# Patient Record
Sex: Female | Born: 1965 | Race: White | Hispanic: No | Marital: Married | Smoking: Never smoker
Health system: Southern US, Community
[De-identification: ages and names within clinical notes are randomized; demographics above are authoritative.]

## PROBLEM LIST (undated history)

## (undated) DIAGNOSIS — E785 Hyperlipidemia, unspecified: Secondary | ICD-10-CM

## (undated) HISTORY — PX: SHOULDER SURGERY: SHX246

## (undated) HISTORY — PX: LASIK: SHX215

---

## 2004-04-03 ENCOUNTER — Inpatient Hospital Stay (HOSPITAL_COMMUNITY): Admission: AD | Admit: 2004-04-03 | Discharge: 2004-04-04 | Payer: Self-pay | Admitting: Obstetrics & Gynecology

## 2005-01-08 ENCOUNTER — Inpatient Hospital Stay (HOSPITAL_COMMUNITY): Admission: AD | Admit: 2005-01-08 | Discharge: 2005-01-08 | Payer: Self-pay | Admitting: Family Medicine

## 2006-04-11 ENCOUNTER — Other Ambulatory Visit: Admission: RE | Admit: 2006-04-11 | Discharge: 2006-04-11 | Payer: Self-pay | Admitting: Obstetrics and Gynecology

## 2006-04-29 ENCOUNTER — Ambulatory Visit (HOSPITAL_COMMUNITY): Admission: RE | Admit: 2006-04-29 | Discharge: 2006-04-29 | Payer: Self-pay | Admitting: Obstetrics and Gynecology

## 2007-05-22 ENCOUNTER — Ambulatory Visit (HOSPITAL_COMMUNITY): Admission: RE | Admit: 2007-05-22 | Discharge: 2007-05-22 | Payer: Self-pay | Admitting: Obstetrics and Gynecology

## 2007-06-01 ENCOUNTER — Encounter: Admission: RE | Admit: 2007-06-01 | Discharge: 2007-06-01 | Payer: Self-pay | Admitting: Obstetrics and Gynecology

## 2007-10-28 ENCOUNTER — Ambulatory Visit: Payer: Self-pay | Admitting: Vascular Surgery

## 2008-01-27 ENCOUNTER — Ambulatory Visit: Payer: Self-pay | Admitting: Vascular Surgery

## 2008-03-02 ENCOUNTER — Ambulatory Visit: Payer: Self-pay | Admitting: Vascular Surgery

## 2008-03-09 ENCOUNTER — Ambulatory Visit: Payer: Self-pay | Admitting: Vascular Surgery

## 2008-04-22 ENCOUNTER — Encounter: Admission: RE | Admit: 2008-04-22 | Discharge: 2008-04-22 | Payer: Self-pay | Admitting: Obstetrics and Gynecology

## 2008-05-13 ENCOUNTER — Ambulatory Visit: Payer: Self-pay | Admitting: Vascular Surgery

## 2009-05-26 ENCOUNTER — Encounter: Payer: Self-pay | Admitting: Endocrinology

## 2009-10-12 ENCOUNTER — Encounter: Admission: RE | Admit: 2009-10-12 | Discharge: 2009-10-12 | Payer: Self-pay | Admitting: Obstetrics and Gynecology

## 2010-10-17 ENCOUNTER — Ambulatory Visit (HOSPITAL_COMMUNITY)
Admission: RE | Admit: 2010-10-17 | Discharge: 2010-10-17 | Payer: Self-pay | Source: Home / Self Care | Admitting: Obstetrics and Gynecology

## 2011-04-23 NOTE — Assessment & Plan Note (Signed)
OFFICE VISIT   [name], [name]  DOB:  1966-03-11                                       01/27/2008  NFAOZ#:[id number]   The patient presents today for continued followup of her lower extremity  venous pathology.  I had seen her initially in November.  She continues  to have difficulty with left leg pain and prolonged standing.  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  She has  difficulty with housework and chores due to the leg pain, and also walks  for exercise, but has pain associated with this as well.  She has worn  thigh-high graduated compression garments for over 3 months and reports  this is giving no relief of symptoms.  She elevates her legs as much as  possible, and also takes ibuprofen 600 mg t.i.d. with no relief of her  symptoms.  She did undergo formal duplex today in our office, and this  confirmed reflux throughout her left greater saphenous vein from the  saphenofemoral junction distally.  Fortunately, her deep system is  competent with no evidence of reflux.  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  I explained that this  would be expected to give relief of her discomfort due to the severe  venous hypertension that she has.  I explained that the procedure is an  outpatient under local anesthesia in our office.  We will assure  insurance coverage, and then proceed with treatment at her convenience.   Larina Earthly, M.D.  Electronically Signed   TFE/MEDQ  D:  01/27/2008  T:  01/28/2008  Job:  [id number]   cc:   Dalbert Mayotte, M.D.

## 2011-04-23 NOTE — Assessment & Plan Note (Signed)
OFFICE VISIT   [name] [name], [name] [name]  DOB:  Dec 02, 1966                                       05/13/2008  ZOXWR#:[id number]   The patient presents today for final followup following her left leg  laser ablation and stab phlebectomy.  She is quite pleased with her  result.  She has no complications following this and has a resolution of  the bruising and erythema around the time of the procedure.  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  She understands this is of no concern regarding  medical issues and is cosmetic concern only.  I plan to see her again on  a p.r.n. basis.   Larina Earthly, M.D.  Electronically Signed   TFE/MEDQ  D:  05/13/2008  T:  05/16/2008  Job:  [id number]   cc:   Dalbert Mayotte, M.D.

## 2011-04-23 NOTE — Assessment & Plan Note (Signed)
OFFICE VISIT   [name] [name], [name] [name]  DOB:  03-Jun-1966                                       03/09/2008  AOZHY#:[id number]   The patient presents today for 1-week followup of laser ablation for  left greater saphenous vein and stab phlebectomy of multiple tributary  varicosities.  She has done well since her procedure.  She was able to  return to work 2 days following the procedure.  She did report some  soreness that was controlled with ibuprofen.  She has the usual amount  of bruising in the thigh and there is soreness over the ablation site.  She has minimal bruising and no tenderness over the stab phlebectomy  sites.  She underwent limited venous duplex evaluation in our office  today, and it reveals closure of her saphenous vein throughout her thigh  up to the saphenofemoral junction.  Her common femoral vein is widely  patent without injury.  I am quite pleased with the patient's initial  results.  Plan to see her again in 2 months for final followup.   Larina Earthly, M.D.  Electronically Signed   TFE/MEDQ  D:  03/09/2008  T:  03/09/2008  Job:  [id number]   cc:   Dalbert Mayotte, M.D.

## 2011-04-23 NOTE — Consult Note (Signed)
NEW PATIENT CONSULTATION   [name], [name]  DOB:  1966/08/12                                       10/28/2007  ZOXWR#:[id number]   HISTORY OF PRESENT ILLNESS:  [name] [name] presents today for evaluation of  her left leg venous varicosities.  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  Over the past 6 months she has had progressive pain  associated with these.  The pain is more pronounced at the end of long  shifts of standing and does have pain specifically over the varicosities  and also a dull aching sensation in her distal calf at the end of the  day.  She does not have any history of superficial thrombophlebitis,  deep venous thrombosis or hemorrhage.   PAST MEDICAL HISTORY:  Negative for any major medical difficulties.  She  does have a history of a tonsillectomy as a child, ovarian cyst removal  in 1990, tubal ligation 1999.   SOCIAL HISTORY:  She is married with 2 children and does not smoke or  drink alcohol.   Her weight is 127 pounds, height 5 feet 1 inches.   REVIEW OF SYSTEMS:  Her review of systems is totally negative aside from  her leg discomfort related to the varicosities.   ALLERGIES:  No known drug allergies.   MEDICATIONS:  Loestrin and ibuprofen p.r.n. leg discomfort.   PHYSICAL EXAMINATION:  Well-developed, well-nourished white female  appearing her stated age of 2.  Blood pressure is 102/60, pulse 64,  respirations 16.  Her dorsalis pedis pulses are 2+ bilaterally.  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  On hand held duplex, she does  appear to be reflex in the saphenous vein above this the saphenous vein  is feeding into this particular varicosity  at the level of the knee.  I  discussed options with [name].  We have fitted her with thigh-high  graduated compression garments 20 to 30 millimeters of mercury and I  instructed her on the use of these.  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 Also she is using ibuprofen 600 mg p.r.n. pain.  She will being the graduated compression garment usage and we will see  her again in 3 months for a continued discussion.  She will also undergo  formal duplex evaluation at that time.  I did discuss the option of  laser ablation of her saphenous vein with tributary varicosity  phlebectomy as an outpatient should she fail conservative therapy.   [name] [name], M.D.  Electronically Signed   TFE/MEDQ  D:  10/28/2007  T:  10/29/2007  Job:  [id number]   cc:   [name] [name], M.D.

## 2011-04-23 NOTE — Procedures (Signed)
LOWER EXTREMITY VENOUS REFLUX EXAM   INDICATION:  Left lower extremity varicose vein with pain.   EXAM:  Using color-flow imaging and pulse Doppler spectral analysis, the  left common femoral, superficial femoral, popliteal, posterior tibial,  greater and lesser saphenous veins are evaluated.  There is no evidence  suggesting deep venous insufficiency in the left lower extremity.   The left saphenofemoral junction is not competent.  The left GSV is not  competent with the caliber as described below.   The left proximal short saphenous vein demonstrates competency.   GSV Diameter (used if found to be incompetent only)                                            Right    Left  Proximal Greater Saphenous Vein           cm       0.99 cm  Proximal-to-mid-thigh                     cm       0.49 cm  Mid thigh                                 cm       0.48 cm  Mid-distal thigh                          cm       0.53 cm  Distal thigh                              cm       0.45 cm  Knee                                      cm       0.44 cm   IMPRESSION:  1. Left greater saphenous vein reflux is identified with the caliber      ranging from 0.44 cm to 0.99 cm knee to groin.  2. The left greater saphenous vein is not aneurysmal.  3. The left greater saphenous vein is not tortuous.  4. The deep venous system is competent.  5. The left lesser saphenous vein is competent.  6. No evidence of deep venous thrombosis or superficial thrombosis in      the left lower extremity.  7. Large varicose vein branches off of left greater saphenous vein      just proximal to the knee.   ___________________________________________  Larina Earthly, M.D.   AS/MEDQ  D:  01/27/2008  T:  01/28/2008  Job:  [id number]

## 2011-09-09 ENCOUNTER — Other Ambulatory Visit (HOSPITAL_COMMUNITY): Payer: Self-pay | Admitting: Obstetrics and Gynecology

## 2011-09-09 DIAGNOSIS — Z1231 Encounter for screening mammogram for malignant neoplasm of breast: Secondary | ICD-10-CM

## 2011-10-18 ENCOUNTER — Ambulatory Visit (HOSPITAL_COMMUNITY): Payer: BC Managed Care – PPO | Attending: Obstetrics and Gynecology

## 2011-11-20 ENCOUNTER — Ambulatory Visit (HOSPITAL_COMMUNITY)
Admission: RE | Admit: 2011-11-20 | Discharge: 2011-11-20 | Disposition: A | Discharge status: Home / Self Care | Payer: BC Managed Care – PPO | Source: Ambulatory Visit | Attending: Obstetrics and Gynecology | Admitting: Obstetrics and Gynecology

## 2011-11-20 DIAGNOSIS — Z1231 Encounter for screening mammogram for malignant neoplasm of breast: Secondary | ICD-10-CM | POA: Insufficient documentation

## 2012-10-27 ENCOUNTER — Other Ambulatory Visit (HOSPITAL_COMMUNITY): Payer: Self-pay | Admitting: Obstetrics and Gynecology

## 2012-10-27 DIAGNOSIS — Z1231 Encounter for screening mammogram for malignant neoplasm of breast: Secondary | ICD-10-CM

## 2012-11-26 ENCOUNTER — Ambulatory Visit (HOSPITAL_COMMUNITY)
Admission: RE | Admit: 2012-11-26 | Discharge: 2012-11-26 | Disposition: A | Discharge status: Home / Self Care | Payer: BC Managed Care – PPO | Source: Ambulatory Visit | Attending: Obstetrics and Gynecology | Admitting: Obstetrics and Gynecology

## 2012-11-26 DIAGNOSIS — Z1231 Encounter for screening mammogram for malignant neoplasm of breast: Secondary | ICD-10-CM | POA: Insufficient documentation

## 2012-12-08 ENCOUNTER — Other Ambulatory Visit: Payer: Self-pay | Admitting: Obstetrics and Gynecology

## 2012-12-08 DIAGNOSIS — R928 Other abnormal and inconclusive findings on diagnostic imaging of breast: Secondary | ICD-10-CM

## 2012-12-18 ENCOUNTER — Ambulatory Visit
Admission: RE | Admit: 2012-12-18 | Discharge: 2012-12-18 | Disposition: A | Discharge status: Home / Self Care | Payer: BC Managed Care – PPO | Source: Ambulatory Visit | Attending: Obstetrics and Gynecology | Admitting: Obstetrics and Gynecology

## 2012-12-18 DIAGNOSIS — R928 Other abnormal and inconclusive findings on diagnostic imaging of breast: Secondary | ICD-10-CM

## 2013-09-29 DIAGNOSIS — F32A Depression, unspecified: Secondary | ICD-10-CM | POA: Insufficient documentation

## 2013-09-29 DIAGNOSIS — F329 Major depressive disorder, single episode, unspecified: Secondary | ICD-10-CM | POA: Insufficient documentation

## 2013-12-22 ENCOUNTER — Other Ambulatory Visit: Payer: Self-pay | Admitting: Obstetrics and Gynecology

## 2013-12-22 DIAGNOSIS — Z1231 Encounter for screening mammogram for malignant neoplasm of breast: Secondary | ICD-10-CM

## 2013-12-28 ENCOUNTER — Ambulatory Visit (INDEPENDENT_AMBULATORY_CARE_PROVIDER_SITE_OTHER): Payer: BC Managed Care – PPO

## 2013-12-28 DIAGNOSIS — Z1231 Encounter for screening mammogram for malignant neoplasm of breast: Secondary | ICD-10-CM

## 2014-07-06 DIAGNOSIS — M419 Scoliosis, unspecified: Secondary | ICD-10-CM | POA: Insufficient documentation

## 2015-04-23 ENCOUNTER — Emergency Department
Admission: EM | Admit: 2015-04-23 | Discharge: 2015-04-23 | Disposition: A | Discharge status: Home / Self Care | Payer: BLUE CROSS/BLUE SHIELD | Source: Home / Self Care | Attending: Emergency Medicine | Admitting: Emergency Medicine

## 2015-04-23 ENCOUNTER — Encounter: Payer: Self-pay | Admitting: *Deleted

## 2015-04-23 DIAGNOSIS — S61219A Laceration without foreign body of unspecified finger without damage to nail, initial encounter: Secondary | ICD-10-CM

## 2015-04-23 HISTORY — DX: Hyperlipidemia, unspecified: E78.5

## 2015-04-23 NOTE — Discharge Instructions (Signed)

## 2015-04-23 NOTE — ED Notes (Signed)
Pt was cutting flowers out of her garden at approx 330 today and cut her L ring finger.  Pain 6/10,  She says she is up to date with her tetanus shot.

## 2015-04-24 NOTE — ED Provider Notes (Signed)
CSN: [id number]     Arrival date & time 04/23/15  1551 History   First MD Initiated Contact with Patient 04/23/15 1613     Chief Complaint  Patient presents with  . Extremity Laceration    L ring finger     (Consider location/radiation/quality/duration/timing/severity/associated sxs/prior Treatment) Patient is a 49 y.o. female presenting with hand pain. The history is provided by the patient. No language interpreter was used.  Hand Pain This is a new problem. The current episode started today. The problem occurs constantly. The problem has been unchanged. Nothing aggravates the symptoms. She has tried nothing for the symptoms. The treatment provided no relief.  Pt cut left ring finger while cutting flowers in her garden.  Pt complains of a cut to her finger.    Past Medical History  Diagnosis Date  . Hyperlipemia    Past Surgical History  Procedure Laterality Date  . Lasik     Family History  Problem Relation Age of Onset  . Renal Disease Father   . Heart disease Father    History  Substance Use Topics  . Smoking status: Never Smoker   . Smokeless tobacco: Never Used  . Alcohol Use: No   OB History    No data available     Review of Systems  All other systems reviewed and are negative.     Allergies  Review of patient's allergies indicates no known allergies.  Home Medications   Prior to Admission medications   Medication Sig Start Date End Date Taking? Authorizing Provider  escitalopram (LEXAPRO) 20 MG tablet Take 20 mg by mouth daily.   Yes Historical Provider, MD  norethindrone-ethinyl estradiol (JUNEL FE,GILDESS FE,LOESTRIN FE) 1-20 MG-MCG tablet Take 1 tablet by mouth daily.   Yes Historical Provider, MD   BP 112/71 mmHg  Pulse 74  Temp(Src) 98.1 F (36.7 C) (Oral)  Ht 5\' 1"  (1.549 m)  Wt 138 lb (62.596 kg)  BMI 26.09 kg/m2  SpO2 96% Physical Exam  Constitutional: She is oriented to person, place, and time. She appears well-developed and  well-nourished.  HENT:  Head: Normocephalic.  Eyes: EOM are normal.  Musculoskeletal: Normal range of motion.  5mm lacertion tip of left index finger,  Slightly larger than a paper cut, minimal gapping with pulling apart  Neurological: She is alert and oriented to person, place, and time.  Skin: Skin is warm.  Psychiatric: She has a normal mood and affect.  Nursing note and vitals reviewed.   ED Course  LACERATION REPAIR Date/Time: 04/24/2015 8:13 AM Performed by: Elson AreasSOFIA, Roseana Rhine K Authorized by: Elson AreasSOFIA, Marc Leichter K Consent: Verbal consent obtained. Risks and benefits: risks, benefits and alternatives were discussed Patient understanding: patient states understanding of the procedure being performed Time out: Immediately prior to procedure a "time out" was called to verify the correct patient, procedure, equipment, support staff and site/side marked as required. Laceration length: 0.5 cm Tendon involvement: none Nerve involvement: none Preparation: Patient was prepped and draped in the usual sterile fashion. Amount of cleaning: standard Debridement: none Skin closure: glue Approximation difficulty: simple Patient tolerance: Patient tolerated the procedure well with no immediate complications   (including critical care time) Labs Review Labs Reviewed - No data to display  Imaging Review No results found.   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

## 2016-01-28 ENCOUNTER — Emergency Department
Admission: EM | Admit: 2016-01-28 | Discharge: 2016-01-28 | Disposition: A | Discharge status: Home / Self Care | Payer: BLUE CROSS/BLUE SHIELD | Source: Home / Self Care | Attending: Family Medicine | Admitting: Family Medicine

## 2016-01-28 ENCOUNTER — Encounter: Payer: Self-pay | Admitting: Emergency Medicine

## 2016-01-28 DIAGNOSIS — N3090 Cystitis, unspecified without hematuria: Secondary | ICD-10-CM | POA: Diagnosis not present

## 2016-01-28 LAB — POCT URINALYSIS DIP (MANUAL ENTRY)
BILIRUBIN UA: NEGATIVE
Bilirubin, UA: NEGATIVE
GLUCOSE UA: NEGATIVE
Nitrite, UA: NEGATIVE
SPEC GRAV UA: 1.025
Urobilinogen, UA: 0.2
pH, UA: 5.5

## 2016-01-28 MED ORDER — PHENAZOPYRIDINE HCL 200 MG PO TABS: 200.0000 mg | ORAL_TABLET | Freq: Three times a day (TID) | ORAL | Status: DC

## 2016-01-28 MED ORDER — NITROFURANTOIN MONOHYD MACRO 100 MG PO CAPS: 100.0000 mg | ORAL_CAPSULE | Freq: Two times a day (BID) | ORAL | Status: DC

## 2016-01-28 NOTE — Discharge Instructions (Signed)
Increase fluid intake. °If symptoms become significantly worse during the night or over the weekend, proceed to the local emergency room.  ° ° °Urinary Tract Infection °Urinary tract infections (UTIs) can develop anywhere along your urinary tract. Your urinary tract is your body's drainage system for removing wastes and extra water. Your urinary tract includes two kidneys, two ureters, a bladder, and a urethra. Your kidneys are a pair of bean-shaped organs. Each kidney is about the size of your fist. They are located below your ribs, one on each side of your spine. °CAUSES °Infections are caused by microbes, which are microscopic organisms, including fungi, viruses, and bacteria. These organisms are so small that they can only be seen through a microscope. Bacteria are the microbes that most commonly cause UTIs. °SYMPTOMS  °Symptoms of UTIs may vary by age and gender of the patient and by the location of the infection. Symptoms in young women typically include a frequent and intense urge to urinate and a painful, burning feeling in the bladder or urethra during urination. Older women and men are more likely to be tired, shaky, and weak and have muscle aches and abdominal pain. A fever may mean the infection is in your kidneys. Other symptoms of a kidney infection include pain in your back or sides below the ribs, nausea, and vomiting. °DIAGNOSIS °To diagnose a UTI, your caregiver will ask you about your symptoms. Your caregiver will also ask you to provide a urine sample. The urine sample will be tested for bacteria and white blood cells. White blood cells are made by your body to help fight infection. °TREATMENT  °Typically, UTIs can be treated with medication. Because most UTIs are caused by a bacterial infection, they usually can be treated with the use of antibiotics. The choice of antibiotic and length of treatment depend on your symptoms and the type of bacteria causing your infection. °HOME CARE  INSTRUCTIONS °· If you were prescribed antibiotics, take them exactly as your caregiver instructs you. Finish the medication even if you feel better after you have only taken some of the medication. °· Drink enough water and fluids to keep your urine clear or pale yellow. °· Avoid caffeine, tea, and carbonated beverages. They tend to irritate your bladder. °· Empty your bladder often. Avoid holding urine for long periods of time. °· Empty your bladder before and after sexual intercourse. °· After a bowel movement, women should cleanse from front to back. Use each tissue only once. °SEEK MEDICAL CARE IF:  °· You have back pain. °· You develop a fever. °· Your symptoms do not begin to resolve within 3 days. °SEEK IMMEDIATE MEDICAL CARE IF:  °· You have severe back pain or lower abdominal pain. °· You develop chills. °· You have nausea or vomiting. °· You have continued burning or discomfort with urination. °MAKE SURE YOU:  °· Understand these instructions. °· Will watch your condition. °· Will get help right away if you are not doing well or get worse. °  °This information is not intended to replace advice given to you by your health care provider. Make sure you discuss any questions you have with your health care provider. °  °Document Released: 09/04/2005 Document Revised: 08/16/2015 Document Reviewed: 01/03/2012 °Elsevier Interactive Patient Education ©2016 [employer] ° °

## 2016-01-28 NOTE — ED Notes (Signed)
Pt c/o possible UTI, right flank pain radiating around to side.  Having pressure, frequency, urgency and dysuria.  Pt does have a history of kidney stones.

## 2016-01-28 NOTE — ED Provider Notes (Signed)
CSN: [id number]     Arrival date & time 01/28/16  1218 History   First MD Initiated Contact with Patient 01/28/16 1300     Chief Complaint  Patient presents with  . Urinary Tract Infection      HPI Comments: Patient developed right flank pain radiating to her right abdomen yesterday with urinary frequency, urgency, and dysuria.  She had nausea without vomiting.  She has a past history of kidney stones.  She has noted no hematuria.  No fevers, chills, and sweats.  Patient is a 50 y.o. female presenting with dysuria. The history is provided by the patient.  Dysuria Pain quality:  Burning Pain severity:  Mild Onset quality:  Sudden Duration:  1 day Timing:  Constant Progression:  Unchanged Chronicity:  New Recent urinary tract infections: no   Relieved by:  None tried Worsened by:  Nothing tried Ineffective treatments:  None tried Urinary symptoms: frequent urination and hesitancy   Urinary symptoms: no discolored urine, no foul-smelling urine, no hematuria and no bladder incontinence   Associated symptoms: flank pain   Associated symptoms: no abdominal pain, no fever, no genital lesions, no nausea, no vaginal discharge and no vomiting   Risk factors: hx of urolithiasis     Past Medical History  Diagnosis Date  . Hyperlipemia    Past Surgical History  Procedure Laterality Date  . Lasik     Family History  Problem Relation Age of Onset  . Renal Disease Father   . Heart disease Father    Social History  Substance Use Topics  . Smoking status: Never Smoker   . Smokeless tobacco: Never Used  . Alcohol Use: No   OB History    No data available     Review of Systems  Constitutional: Negative for fever.  Gastrointestinal: Negative for nausea, vomiting and abdominal pain.  Genitourinary: Positive for dysuria and flank pain. Negative for vaginal discharge.  All other systems reviewed and are negative.   Allergies  Review of patient's allergies indicates no known  allergies.  Home Medications   Prior to Admission medications   Medication Sig Start Date End Date Taking? Authorizing Provider  Cholecalciferol (VITAMIN D-3 PO) Take by mouth.   Yes Historical Provider, MD  Cyanocobalamin (VITAMIN B12 PO) Take by mouth.   Yes Historical Provider, MD  norethindrone-ethinyl estradiol (MICROGESTIN,JUNEL,LOESTRIN) 1-20 MG-MCG tablet Take 1 tablet by mouth daily.   Yes Historical Provider, MD  escitalopram (LEXAPRO) 20 MG tablet Take 20 mg by mouth daily.    Historical Provider, MD  nitrofurantoin, macrocrystal-monohydrate, (MACROBID) 100 MG capsule Take 1 capsule (100 mg total) by mouth 2 (two) times daily. Take with food. 01/28/16   Lattie Haw, MD  phenazopyridine (PYRIDIUM) 200 MG tablet Take 1 tablet (200 mg total) by mouth 3 (three) times daily. Take after meals. 01/28/16   Lattie Haw, MD   Meds Ordered and Administered this Visit  Medications - No data to display  BP 112/72 mmHg  Pulse 71  Temp(Src) 98.4 F (36.9 C) (Oral)  Ht  (1.549 m)  Wt 141 lb 8 oz (64.184 kg)  BMI 26.75 kg/m2  SpO2 95%  LMP 01/16/2016 No data found.   Physical Exam Nursing notes and Vital Signs reviewed. Appearance:  Patient appears stated age, and in no acute distress.    Eyes:  Pupils are equal, round, and reactive to light and accomodation.  Extraocular movement is intact.  Conjunctivae are not inflamed   Pharynx:  Normal;  moist mucous membranes  Neck:  Supple.  No adenopathy Lungs:  Clear to auscultation.  Breath sounds are equal.  Moving air well. Heart:  Regular rate and rhythm without murmurs, rubs, or gallops.  Abdomen:  Nontender without masses or hepatosplenomegaly.  Bowel sounds are present.  No CVA or flank tenderness.  Extremities:  No edema.  Skin:  No rash present.    ED Course  Procedures none    Labs Reviewed  POCT URINALYSIS DIP (MANUAL ENTRY) - Abnormal; Notable for the following:    Blood, UA moderate (*)    Protein Ur, POC trace  (*)    Leukocytes, UA moderate (2+) (*)    All other components within normal limits  URINE CULTURE      MDM   1. Cystitis; ?nephrolithiasis    Urine culture pending. Begin Macrobid  BID for one week. Rx for Pyridium Increase fluid intake. If symptoms become significantly worse during the night or over the weekend, proceed to the local emergency room.  Followup with Family Doctor if not improved in one week.     Lattie Haw, MD 01/30/16 2206

## 2016-01-29 ENCOUNTER — Other Ambulatory Visit: Payer: Self-pay | Admitting: Obstetrics and Gynecology

## 2016-01-29 DIAGNOSIS — R928 Other abnormal and inconclusive findings on diagnostic imaging of breast: Secondary | ICD-10-CM

## 2016-01-31 LAB — URINE CULTURE

## 2016-02-01 ENCOUNTER — Telehealth: Payer: Self-pay | Admitting: *Deleted

## 2016-02-14 ENCOUNTER — Ambulatory Visit
Admission: RE | Admit: 2016-02-14 | Discharge: 2016-02-14 | Disposition: A | Discharge status: Home / Self Care | Payer: BLUE CROSS/BLUE SHIELD | Source: Ambulatory Visit | Attending: Obstetrics and Gynecology | Admitting: Obstetrics and Gynecology

## 2016-02-14 DIAGNOSIS — R928 Other abnormal and inconclusive findings on diagnostic imaging of breast: Secondary | ICD-10-CM

## 2018-02-05 ENCOUNTER — Other Ambulatory Visit: Payer: Self-pay | Admitting: Obstetrics and Gynecology

## 2018-02-05 DIAGNOSIS — N6320 Unspecified lump in the left breast, unspecified quadrant: Secondary | ICD-10-CM

## 2018-02-11 ENCOUNTER — Ambulatory Visit
Admission: RE | Admit: 2018-02-11 | Discharge: 2018-02-11 | Disposition: A | Discharge status: Home / Self Care | Payer: BLUE CROSS/BLUE SHIELD | Source: Ambulatory Visit | Attending: Obstetrics and Gynecology | Admitting: Obstetrics and Gynecology

## 2018-02-11 DIAGNOSIS — N6320 Unspecified lump in the left breast, unspecified quadrant: Secondary | ICD-10-CM

## 2018-05-06 DIAGNOSIS — M79642 Pain in left hand: Secondary | ICD-10-CM | POA: Insufficient documentation

## 2019-03-11 ENCOUNTER — Telehealth (INDEPENDENT_AMBULATORY_CARE_PROVIDER_SITE_OTHER): Payer: Self-pay

## 2019-03-11 NOTE — Telephone Encounter (Signed)
Called patient. No answer LMOM to return our call. If they return call, please ask them screening questions below. Thank you.   Do you have now or have you had in the past 7 days a fever and/or chills?   Do you have now or have you had in the past 7 days a cough?   Do you have now or have you had in the last 7 days nausea, vomiting or abdominal pain?   Have you been exposed to anyone who has tested positive for COVID-19?   Have you or anyone who lives with you traveled within the last month? 

## 2019-03-15 ENCOUNTER — Other Ambulatory Visit: Payer: Self-pay

## 2019-03-15 ENCOUNTER — Ambulatory Visit (INDEPENDENT_AMBULATORY_CARE_PROVIDER_SITE_OTHER): Payer: BLUE CROSS/BLUE SHIELD | Admitting: Family Medicine

## 2019-03-15 ENCOUNTER — Ambulatory Visit (INDEPENDENT_AMBULATORY_CARE_PROVIDER_SITE_OTHER): Payer: BLUE CROSS/BLUE SHIELD

## 2019-03-15 ENCOUNTER — Encounter (INDEPENDENT_AMBULATORY_CARE_PROVIDER_SITE_OTHER): Payer: Self-pay | Admitting: Family Medicine

## 2019-03-15 VITALS — Ht 61.0 in | Wt 136.0 lb

## 2019-03-15 DIAGNOSIS — R202 Paresthesia of skin: Secondary | ICD-10-CM

## 2019-03-15 DIAGNOSIS — M25551 Pain in right hip: Secondary | ICD-10-CM

## 2019-03-15 DIAGNOSIS — R200 Anesthesia of skin: Secondary | ICD-10-CM | POA: Diagnosis not present

## 2019-03-15 DIAGNOSIS — M1812 Unilateral primary osteoarthritis of first carpometacarpal joint, left hand: Secondary | ICD-10-CM

## 2019-03-15 DIAGNOSIS — E785 Hyperlipidemia, unspecified: Secondary | ICD-10-CM | POA: Insufficient documentation

## 2019-03-15 MED ORDER — NABUMETONE 750 MG PO TABS: 750.0000 mg | ORAL_TABLET | Freq: Two times a day (BID) | ORAL | 6 refills | Status: DC | PRN

## 2019-03-15 MED ORDER — TIZANIDINE HCL 2 MG PO TABS: 2.0000 mg | ORAL_TABLET | Freq: Every evening | ORAL | 1 refills | Status: DC | PRN

## 2019-03-15 NOTE — Patient Instructions (Signed)
   Zinc 20-30 mg daily (immune function)  Arthritis:  Glucosamine Sulfate 1,000 mg twice daily  Turmeric 500 mg twice daily  Magnesium 400 mg daily

## 2019-03-15 NOTE — Progress Notes (Signed)
Office Visit Note   Patient: [name] [name]           Date of Birth: Sep 25, 1966           MRN: [id number] Visit Date: 03/15/2019 Requested by: No referring provider defined for this encounter. PCP: [place], [name] [name]  Subjective: Chief Complaint  Patient presents with  . Right Hip - Pain    C/O Pain over right SI joint, chiropractor x 5 years with no relief from this pain, onset 4-5 months, NKI,     HPI: She is a 53 year old seen at the request of Dr. Juluis [name] for right posterior hip pain.  She also is having troubles with numbness and tingling in her right hand, as well as left thumb pain.  She has been been diagnosed with scoliosis since childhood.  In the past 5 years she has been going to her chiropractor with good relief of symptoms, but in the past 4 or 5 months the treatments are not helping like they used to.  Pain seems to be near the SI joint with some pain in the buttocks area and occasional radiation into the hamstring.  It is worst when transitioning from sitting to standing, worse when she first gets out of bed and starts walking, and better after she moves around a little while.  She denies any numbness or tingling in her feet, no bowel or bladder dysfunction.  She takes ibuprofen on a regular basis with some improvement.  She has had numbness and tingling in her right hand intermittently for several months.  No weakness, no significant pain.  No neck pain with this.  It bothers her when she is holding onto something such as her mobile phone.  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 Amanda [name].  She was told she might need surgery 1 day.  She was given an over-the-counter brace which did not help much.  She is trying to avoid surgery.                ROS: Denies fevers or chills, no respiratory symptoms.  All other systems were reviewed and are negative.  Objective: Vital Signs: Ht 5\' 1"  (1.549 m)   Wt 136 lb (61.7 kg)   BMI 25.70 kg/m   Physical Exam:   General:  Alert and oriented, in no acute distress. Pulm:  Breathing unlabored. Psy:  Normal mood, congruent affect. Skin: No rash on her skin. Right hand: Full range of motion of her elbow and wrist pain-free.  Slight subluxation of the ulnar nerve at the elbow but negative ulnar compression test.  Positive Tinel's at the right carpal tunnel and positive Phalen's test.  No atrophy of the thenar muscles, intrinsic hand strength is normal. Left hand: Tender at the thumb [facility] joint, negative grind test. Low back: She has significant scoliosis.  She is tender along the right sacroiliac joint and also in the right sciatic notch.  No pain over the greater trochanter, good range of motion of her hips with no pain.  Lower extremity strength and reflexes are normal.  Imaging: X-rays pelvis and right hip: Hip joints are well-preserved with no significant arthritic change.  Right-sided SI joint is slightly sclerotic compared to the left.  She has substantial lumbar scoliosis.  No sign of compression deformity.  Assessment & Plan: 1.  Right posterior hip pain, possibilities include sacroiliac dysfunction, piriformis syndrome, or possibly lumbar foraminal stenosis.  Neurologic exam is nonfocal. -At some point she  might benefit from lumbar MRI scan but due to coronavirus, this cannot be done right now.  We will try a different anti-inflammatory, muscle relaxant as needed, and continued chiropractic.  Start doing piriformis stretches as well.  If pain became severe, could do a sacroiliac injection but would like to avoid that for now.  2.  Right hand numbness, suspect carpal tunnel syndrome - Carpal tunnel night splint for the next 6 weeks.  If symptoms persist then nerve studies followed by injection if indicated, or surgical consult if indicated.  3.  Left thumb CMC arthrosis - Hand therapy referral for custom splint. -Glucosamine sulfate, turmeric, avoid sugar intake.     Procedures: No procedures  performed  No notes on file     PMFS History: Patient Active Problem List   Diagnosis Date Noted  . Hyperlipidemia 03/15/2019  . Scoliosis 07/06/2014  . Depression 09/29/2013   Past Medical History:  Diagnosis Date  . Hyperlipemia     Family History  Problem Relation Age of Onset  . Renal Disease Father   . Heart disease Father     Past Surgical History:  Procedure Laterality Date  . LASIK     Social History   Occupational History  . Not on file  Tobacco Use  . Smoking status: Never Smoker  . Smokeless tobacco: Never Used  Substance and Sexual Activity  . Alcohol use: No  . Drug use: No  . Sexual activity: Not on file

## 2019-03-19 ENCOUNTER — Encounter (INDEPENDENT_AMBULATORY_CARE_PROVIDER_SITE_OTHER): Payer: Self-pay | Admitting: Family Medicine

## 2019-03-22 ENCOUNTER — Telehealth (INDEPENDENT_AMBULATORY_CARE_PROVIDER_SITE_OTHER): Payer: Self-pay | Admitting: Family Medicine

## 2019-03-22 NOTE — Telephone Encounter (Signed)
Patient states she lost the Rx for physical therapy and would like a call with the name & number of the location. Patients call back # [phone]

## 2019-03-23 NOTE — Telephone Encounter (Signed)
I left information on the patient's voice mail (mobile). We will fax a new Rx to [facility], since it is an Rx for a thumb splint.

## 2019-03-23 NOTE — Telephone Encounter (Signed)
[facility] (to [facility]ib[facility] Mattes)

## 2019-03-23 NOTE — Telephone Encounter (Signed)
Do you remember where you were sending her?

## 2019-04-27 ENCOUNTER — Telehealth: Payer: Self-pay | Admitting: Radiology

## 2019-04-27 DIAGNOSIS — M25551 Pain in right hip: Secondary | ICD-10-CM

## 2019-04-27 NOTE — Telephone Encounter (Signed)
Patient left message that she is ready to proceed with MRI. Ok to order?  CB# [id number]

## 2019-04-27 NOTE — Telephone Encounter (Signed)
MRI ordered

## 2019-05-13 ENCOUNTER — Other Ambulatory Visit: Payer: Self-pay

## 2019-05-13 ENCOUNTER — Ambulatory Visit
Admission: RE | Admit: 2019-05-13 | Discharge: 2019-05-13 | Disposition: A | Discharge status: Home / Self Care | Payer: BLUE CROSS/BLUE SHIELD | Source: Ambulatory Visit | Attending: Family Medicine | Admitting: Family Medicine

## 2019-05-13 ENCOUNTER — Telehealth: Payer: Self-pay | Admitting: Family Medicine

## 2019-05-13 DIAGNOSIS — M25551 Pain in right hip: Secondary | ICD-10-CM

## 2019-05-13 NOTE — Telephone Encounter (Signed)
MRI shows arthritis of the facet joints on the right side at L4-5 and L5-S1.  This could explain the pain.  Presuming pain is still severe, could contemplate referral to Dr. Alvester Morin for injection of the facet joints.

## 2019-06-03 ENCOUNTER — Encounter (INDEPENDENT_AMBULATORY_CARE_PROVIDER_SITE_OTHER): Payer: Self-pay | Admitting: Family Medicine

## 2019-06-03 DIAGNOSIS — M545 Low back pain, unspecified: Secondary | ICD-10-CM

## 2019-06-03 DIAGNOSIS — G8929 Other chronic pain: Secondary | ICD-10-CM

## 2019-06-03 DIAGNOSIS — M25551 Pain in right hip: Secondary | ICD-10-CM

## 2019-07-07 ENCOUNTER — Encounter: Payer: Self-pay | Admitting: Physical Medicine and Rehabilitation

## 2019-07-13 ENCOUNTER — Ambulatory Visit: Payer: Self-pay

## 2019-07-13 ENCOUNTER — Encounter: Payer: Self-pay | Admitting: Physical Medicine and Rehabilitation

## 2019-07-13 ENCOUNTER — Ambulatory Visit (INDEPENDENT_AMBULATORY_CARE_PROVIDER_SITE_OTHER): Payer: BC Managed Care – PPO | Admitting: Physical Medicine and Rehabilitation

## 2019-07-13 VITALS — BP 109/66 | HR 65

## 2019-07-13 DIAGNOSIS — M47816 Spondylosis without myelopathy or radiculopathy, lumbar region: Secondary | ICD-10-CM

## 2019-07-13 MED ORDER — METHYLPREDNISOLONE ACETATE 80 MG/ML IJ SUSP
80.0000 mg | Freq: Once | INTRAMUSCULAR | Status: AC
  Administered 2019-07-13: 80 mg

## 2019-07-13 NOTE — Progress Notes (Signed)
 .  Numeric Pain Rating Scale and Functional Assessment Average Pain 5   In the last MONTH (on 0-10 scale) has pain interfered with the following?  1. General activity like being  able to carry out your everyday physical activities such as walking, climbing stairs, carrying groceries, or moving a chair?  Rating(6)   +Driver, -BT, -Dye Allergies.  

## 2019-08-03 DIAGNOSIS — E782 Mixed hyperlipidemia: Secondary | ICD-10-CM | POA: Diagnosis not present

## 2019-08-03 DIAGNOSIS — Z0000 Encounter for general adult medical examination without abnormal findings: Secondary | ICD-10-CM | POA: Diagnosis not present

## 2019-08-09 DIAGNOSIS — F329 Major depressive disorder, single episode, unspecified: Secondary | ICD-10-CM | POA: Diagnosis not present

## 2019-08-09 DIAGNOSIS — F419 Anxiety disorder, unspecified: Secondary | ICD-10-CM | POA: Diagnosis not present

## 2019-08-09 DIAGNOSIS — Z0000 Encounter for general adult medical examination without abnormal findings: Secondary | ICD-10-CM | POA: Diagnosis not present

## 2019-08-09 DIAGNOSIS — E782 Mixed hyperlipidemia: Secondary | ICD-10-CM | POA: Diagnosis not present

## 2019-08-10 DIAGNOSIS — L920 Granuloma annulare: Secondary | ICD-10-CM | POA: Insufficient documentation

## 2019-09-14 NOTE — Progress Notes (Signed)
[name] [name] - 53 y.o. female MRN [id number]  Date of birth: 11/29/1966  Office Visit Note: Visit Date: 07/13/2019 PCP: [place], Julie M, NP Referred by: [place], Julie M, NP  Subjective: Chief Complaint  Patient presents with  . Lower Back - Pain   HPI:  [name] [name] is a 53 y.o. female who comes in today For planned diagnostic and hopefully therapeutic medial branch blocks of the right L4-5 and L5-S1 facet joints.  Patient's had chronic axial right-sided low back pain for some time.  Failing conservative care through Dr. Junius Roads.  ROS Otherwise per HPI.  Assessment & Plan: Visit Diagnoses:  1. Spondylosis without myelopathy or radiculopathy, lumbar region     Plan: No additional findings.   Meds & Orders:  Meds ordered this encounter  Medications  . methylPREDNISolone acetate (DEPO-MEDROL) injection 80 mg    Orders Placed This Encounter  Procedures  . Facet Injection  . XR C-ARM NO REPORT    Follow-up: No follow-ups on file.   Procedures: No procedures performed  Lumbar Diagnostic Facet Joint Nerve Block with Fluoroscopic Guidance   Patient: [name] [name]      Date of Birth: 1966-02-17 MRN: [id number] PCP: [place], Julie M, NP      Visit Date: 07/13/2019   Universal Protocol:    Date/Time: 10/06/205:45 AM  Consent Given By: the patient  Position: PRONE  Additional Comments: Vital signs were monitored before and after the procedure. Patient was prepped and draped in the usual sterile fashion. The correct patient, procedure, and site was verified.   Injection Procedure Details:  Procedure Site One Meds Administered:  Meds ordered this encounter  Medications  . methylPREDNISolone acetate (DEPO-MEDROL) injection 80 mg     Laterality: Right  Location/Site:  L4-L5 L5-S1  Needle size: 22 ga.  Needle type:spinal  Needle Placement: Oblique pedical  Findings:   -Comments: There was excellent flow of contrast along the articular pillars without intravascular  flow.  Procedure Details: The fluoroscope beam is vertically oriented in AP and then obliqued 15 to 20 degrees to the ipsilateral side of the desired nerve to achieve the "Scotty dog" appearance.  The skin over the target area of the junction of the superior articulating process and the transverse process (sacral ala if blocking the L5 dorsal rami) was locally anesthetized with a 1 ml volume of 1% Lidocaine without Epinephrine.  The spinal needle was inserted and advanced in a trajectory view down to the target.   After contact with periosteum and negative aspirate for blood and CSF, correct placement without intravascular or epidural spread was confirmed by injecting 0.5 ml. of Isovue-250.  A spot radiograph was obtained of this image.    Next, a 0.5 ml. volume of the injectate described above was injected. The needle was then redirected to the other facet joint nerves mentioned above if needed.  Prior to the procedure, the patient was given a Pain Diary which was completed for baseline measurements.  After the procedure, the patient rated their pain every 30 minutes and will continue rating at this frequency for a total of 5 hours.  The patient has been asked to complete the Diary and return to [place] by mail, fax or hand delivered as soon as possible.   Additional Comments:  The patient tolerated the procedure well No complications occurred Dressing: 2 x 2 sterile gauze and Band-Aid    Post-procedure details: Patient was observed during the procedure. Post-procedure instructions were reviewed.  Patient left the clinic in  stable condition.   Clinical History: MRI LUMBAR SPINE WITHOUT CONTRAST  TECHNIQUE: Multiplanar, multisequence MR imaging of the lumbar spine was performed. No intravenous contrast was administered.  COMPARISON:  None.  FINDINGS: Segmentation:  5 lumbar vertebrae based on the available coverage.  Alignment:  Dextroscoliosis centered at L1-2  Vertebrae: Marrow  edema about the right L4-5 and even greater about the right L5-S1 facets. No fracture, discitis, or aggressive bone lesion.  Conus medullaris and cauda equina: Conus extends to the L1-2 level. Conus and cauda equina appear normal.  Paraspinal and other soft tissues: Negative  Disc levels:  L1-L2: Asymmetric left-sided disc narrowing and bulge. No impingement  L2-L3: Asymmetric left-sided disc narrowing with mild bulging. No impingement  L3-L4: Mild disc narrowing and bulging.  No impingement  L4-L5: Degenerative facet spurring asymmetric to the right. There is mild disc narrowing and bulging. No impingement  L5-S1:Degenerative facet spurring asymmetric to the right where there is bulky facet arthropathy. Mild disc bulging. No impingement  IMPRESSION: 1. 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 There is active marrow edema at the levels of arthritis, which may be symptomatic. 2. No neural compression.   Electronically Signed   By: Marnee Spring M.D.   On: 05/13/2019 09:27     Objective:  VS:  HT:    WT:   BMI:     BP:109/66  HR:65bpm  TEMP: ( )  RESP:  Physical Exam  Ortho Exam Imaging: No results found.

## 2019-09-14 NOTE — Procedures (Signed)
Lumbar Diagnostic Facet Joint Nerve Block with Fluoroscopic Guidance   Patient: [name] [name]      Date of Birth: 06-Apr-1966 MRN: [id number] PCP: [place], Julie M, NP      Visit Date: 07/13/2019   Universal Protocol:    Date/Time: 10/06/205:45 AM  Consent Given By: the patient  Position: PRONE  Additional Comments: Vital signs were monitored before and after the procedure. Patient was prepped and draped in the usual sterile fashion. The correct patient, procedure, and site was verified.   Injection Procedure Details:  Procedure Site One Meds Administered:  Meds ordered this encounter  Medications  . methylPREDNISolone acetate (DEPO-MEDROL) injection 80 mg     Laterality: Right  Location/Site:  L4-L5 L5-S1  Needle size: 22 ga.  Needle type:spinal  Needle Placement: Oblique pedical  Findings:   -Comments: There was excellent flow of contrast along the articular pillars without intravascular flow.  Procedure Details: The fluoroscope beam is vertically oriented in AP and then obliqued 15 to 20 degrees to the ipsilateral side of the desired nerve to achieve the "Scotty dog" appearance.  The skin over the target area of the junction of the superior articulating process and the transverse process (sacral ala if blocking the L5 dorsal rami) was locally anesthetized with a 1 ml volume of 1% Lidocaine without Epinephrine.  The spinal needle was inserted and advanced in a trajectory view down to the target.   After contact with periosteum and negative aspirate for blood and CSF, correct placement without intravascular or epidural spread was confirmed by injecting 0.5 ml. of Isovue-250.  A spot radiograph was obtained of this image.    Next, a 0.5 ml. volume of the injectate described above was injected. The needle was then redirected to the other facet joint nerves mentioned above if needed.  Prior to the procedure, the patient was given a Pain Diary which was completed for baseline  measurements.  After the procedure, the patient rated their pain every 30 minutes and will continue rating at this frequency for a total of 5 hours.  The patient has been asked to complete the Diary and return to [place] by mail, fax or hand delivered as soon as possible.   Additional Comments:  The patient tolerated the procedure well No complications occurred Dressing: 2 x 2 sterile gauze and Band-Aid    Post-procedure details: Patient was observed during the procedure. Post-procedure instructions were reviewed.  Patient left the clinic in stable condition.

## 2019-10-07 DIAGNOSIS — D1801 Hemangioma of skin and subcutaneous tissue: Secondary | ICD-10-CM | POA: Diagnosis not present

## 2019-10-07 DIAGNOSIS — L920 Granuloma annulare: Secondary | ICD-10-CM | POA: Diagnosis not present

## 2019-10-07 DIAGNOSIS — L821 Other seborrheic keratosis: Secondary | ICD-10-CM | POA: Diagnosis not present

## 2019-10-07 DIAGNOSIS — M25512 Pain in left shoulder: Secondary | ICD-10-CM | POA: Diagnosis not present

## 2019-10-20 DIAGNOSIS — M6281 Muscle weakness (generalized): Secondary | ICD-10-CM | POA: Diagnosis not present

## 2019-10-20 DIAGNOSIS — M25612 Stiffness of left shoulder, not elsewhere classified: Secondary | ICD-10-CM | POA: Diagnosis not present

## 2019-10-20 DIAGNOSIS — M25512 Pain in left shoulder: Secondary | ICD-10-CM | POA: Diagnosis not present

## 2019-10-27 DIAGNOSIS — M25512 Pain in left shoulder: Secondary | ICD-10-CM | POA: Diagnosis not present

## 2019-10-27 DIAGNOSIS — M25612 Stiffness of left shoulder, not elsewhere classified: Secondary | ICD-10-CM | POA: Diagnosis not present

## 2019-10-27 DIAGNOSIS — M6281 Muscle weakness (generalized): Secondary | ICD-10-CM | POA: Diagnosis not present

## 2019-11-09 DIAGNOSIS — M25512 Pain in left shoulder: Secondary | ICD-10-CM | POA: Diagnosis not present

## 2019-11-09 DIAGNOSIS — M25612 Stiffness of left shoulder, not elsewhere classified: Secondary | ICD-10-CM | POA: Diagnosis not present

## 2019-11-09 DIAGNOSIS — M6281 Muscle weakness (generalized): Secondary | ICD-10-CM | POA: Diagnosis not present

## 2019-11-11 DIAGNOSIS — M6281 Muscle weakness (generalized): Secondary | ICD-10-CM | POA: Diagnosis not present

## 2019-11-11 DIAGNOSIS — M25512 Pain in left shoulder: Secondary | ICD-10-CM | POA: Diagnosis not present

## 2019-11-11 DIAGNOSIS — M25612 Stiffness of left shoulder, not elsewhere classified: Secondary | ICD-10-CM | POA: Diagnosis not present

## 2019-11-16 DIAGNOSIS — M6281 Muscle weakness (generalized): Secondary | ICD-10-CM | POA: Diagnosis not present

## 2019-11-16 DIAGNOSIS — M25612 Stiffness of left shoulder, not elsewhere classified: Secondary | ICD-10-CM | POA: Diagnosis not present

## 2019-11-16 DIAGNOSIS — M25512 Pain in left shoulder: Secondary | ICD-10-CM | POA: Diagnosis not present

## 2019-11-17 DIAGNOSIS — M25512 Pain in left shoulder: Secondary | ICD-10-CM | POA: Diagnosis not present

## 2019-11-17 DIAGNOSIS — M6281 Muscle weakness (generalized): Secondary | ICD-10-CM | POA: Diagnosis not present

## 2019-11-17 DIAGNOSIS — M25612 Stiffness of left shoulder, not elsewhere classified: Secondary | ICD-10-CM | POA: Diagnosis not present

## 2019-11-24 DIAGNOSIS — M25512 Pain in left shoulder: Secondary | ICD-10-CM | POA: Diagnosis not present

## 2019-11-24 DIAGNOSIS — M6281 Muscle weakness (generalized): Secondary | ICD-10-CM | POA: Diagnosis not present

## 2019-11-24 DIAGNOSIS — M25612 Stiffness of left shoulder, not elsewhere classified: Secondary | ICD-10-CM | POA: Diagnosis not present

## 2019-11-29 DIAGNOSIS — M25512 Pain in left shoulder: Secondary | ICD-10-CM | POA: Diagnosis not present

## 2019-11-29 DIAGNOSIS — M25612 Stiffness of left shoulder, not elsewhere classified: Secondary | ICD-10-CM | POA: Diagnosis not present

## 2019-11-29 DIAGNOSIS — M6281 Muscle weakness (generalized): Secondary | ICD-10-CM | POA: Diagnosis not present

## 2019-12-08 DIAGNOSIS — M25512 Pain in left shoulder: Secondary | ICD-10-CM | POA: Diagnosis not present

## 2019-12-08 DIAGNOSIS — M25612 Stiffness of left shoulder, not elsewhere classified: Secondary | ICD-10-CM | POA: Diagnosis not present

## 2019-12-08 DIAGNOSIS — M6281 Muscle weakness (generalized): Secondary | ICD-10-CM | POA: Diagnosis not present

## 2019-12-15 DIAGNOSIS — M25512 Pain in left shoulder: Secondary | ICD-10-CM | POA: Diagnosis not present

## 2019-12-15 DIAGNOSIS — M6281 Muscle weakness (generalized): Secondary | ICD-10-CM | POA: Diagnosis not present

## 2019-12-15 DIAGNOSIS — M25612 Stiffness of left shoulder, not elsewhere classified: Secondary | ICD-10-CM | POA: Diagnosis not present

## 2019-12-21 ENCOUNTER — Encounter (INDEPENDENT_AMBULATORY_CARE_PROVIDER_SITE_OTHER): Payer: Self-pay | Admitting: Family Medicine

## 2019-12-21 DIAGNOSIS — M25512 Pain in left shoulder: Secondary | ICD-10-CM | POA: Diagnosis not present

## 2019-12-21 DIAGNOSIS — M25612 Stiffness of left shoulder, not elsewhere classified: Secondary | ICD-10-CM | POA: Diagnosis not present

## 2019-12-21 DIAGNOSIS — M6281 Muscle weakness (generalized): Secondary | ICD-10-CM | POA: Diagnosis not present

## 2019-12-22 ENCOUNTER — Ambulatory Visit: Payer: BC Managed Care – PPO | Admitting: Family Medicine

## 2019-12-22 ENCOUNTER — Ambulatory Visit: Payer: Self-pay

## 2019-12-22 ENCOUNTER — Encounter: Payer: Self-pay | Admitting: Family Medicine

## 2019-12-22 ENCOUNTER — Other Ambulatory Visit: Payer: Self-pay

## 2019-12-22 DIAGNOSIS — N6009 Solitary cyst of unspecified breast: Secondary | ICD-10-CM | POA: Insufficient documentation

## 2019-12-22 DIAGNOSIS — N946 Dysmenorrhea, unspecified: Secondary | ICD-10-CM | POA: Insufficient documentation

## 2019-12-22 DIAGNOSIS — M25512 Pain in left shoulder: Secondary | ICD-10-CM | POA: Diagnosis not present

## 2019-12-22 DIAGNOSIS — R3129 Other microscopic hematuria: Secondary | ICD-10-CM | POA: Insufficient documentation

## 2019-12-22 DIAGNOSIS — N920 Excessive and frequent menstruation with regular cycle: Secondary | ICD-10-CM | POA: Insufficient documentation

## 2019-12-22 NOTE — Progress Notes (Signed)
   Office Visit Note   Patient: [name] [name]           Date of Birth: Apr 29, 1966           MRN: [id number] Visit Date: 12/22/2019 Requested by: [place], Julie M, NP [address],  [place] [address] PCP: [place], Julie M, NP  Subjective: Chief Complaint  Patient presents with  . Left Shoulder - Pain    Pain x 6-8 weeks. Been doing PT x 4 weeks. Still has decreased ROM. Would like a cortisone injection.    HPI: She is here with left shoulder pain.  Symptoms started about 2 months ago, no injury.  Pain and stiffness, difficult to reach behind her head or behind her back.  She started going to [facility] in [place] and has made some progress, but she still has quite a bit of pain.  She is right-hand dominant.  No history of diabetes, no history of prior shoulder injury.               ROS: No fevers or chills.  All other systems were reviewed and are negative.  Objective: Vital Signs: There were no vitals taken for this visit.  Physical Exam:  General:  Alert and oriented, in no acute distress. Pulm:  Breathing unlabored. Psy:  Normal mood, congruent affect. 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 Left shoulder: 5/5 isometric rotator cuff strength throughout with minimal pain.  She has adhesive capsulitis with abduction of 75 degrees, external rotation of 50 degrees and internal rotation of 45 degrees.  Right shoulder range of motion is greater than 90 degrees abduction, 110 degrees external rotation and about 75 degrees internal rotation.  Imaging: X-rays left shoulder: Well-preserved joint space, no sign of arthritis.  No soft tissue calcifications.    Assessment & Plan: 1.  Left shoulder adhesive capsulitis -Discussed with patient and elected to perform ultrasound-guided glenohumeral injection today.  She will continue with physical therapy.  Follow-up as needed.     Procedures: Procedure: Ultrasound-guided left  glenohumeral injection: After sterile prep with Betadine, injected 8 cc 1% lidocaine without epinephrine and 40 mg methylprednisolone using a 22-gauge spinal needle, passing the needle through approach into the glenohumeral joint.  Injectate seen filling joint capsule.  Pain improved, no change in ROM.         PMFS History: Patient Active Problem List   Diagnosis Date Noted  . Cyst of breast 12/22/2019  . Dysmenorrhea 12/22/2019  . Menorrhagia 12/22/2019  . Microscopic hematuria 12/22/2019  . Hyperlipidemia 03/15/2019  . Pain of left hand 05/06/2018  . Scoliosis 07/06/2014  . Depression 09/29/2013   Past Medical History:  Diagnosis Date  . Hyperlipemia     Family History  Problem Relation Age of Onset  . Renal Disease Father   . Heart disease Father     Past Surgical History:  Procedure Laterality Date  . LASIK     Social History   Occupational History  . Not on file  Tobacco Use  . Smoking status: Never Smoker  . Smokeless tobacco: Never Used  Substance and Sexual Activity  . Alcohol use: No  . Drug use: No  . Sexual activity: Not on file

## 2019-12-31 DIAGNOSIS — M25512 Pain in left shoulder: Secondary | ICD-10-CM | POA: Diagnosis not present

## 2019-12-31 DIAGNOSIS — M25612 Stiffness of left shoulder, not elsewhere classified: Secondary | ICD-10-CM | POA: Diagnosis not present

## 2019-12-31 DIAGNOSIS — M6281 Muscle weakness (generalized): Secondary | ICD-10-CM | POA: Diagnosis not present

## 2020-01-19 DIAGNOSIS — M25512 Pain in left shoulder: Secondary | ICD-10-CM | POA: Diagnosis not present

## 2020-01-19 DIAGNOSIS — M6281 Muscle weakness (generalized): Secondary | ICD-10-CM | POA: Diagnosis not present

## 2020-01-19 DIAGNOSIS — M25612 Stiffness of left shoulder, not elsewhere classified: Secondary | ICD-10-CM | POA: Diagnosis not present

## 2020-02-09 DIAGNOSIS — M6281 Muscle weakness (generalized): Secondary | ICD-10-CM | POA: Diagnosis not present

## 2020-02-09 DIAGNOSIS — M25612 Stiffness of left shoulder, not elsewhere classified: Secondary | ICD-10-CM | POA: Diagnosis not present

## 2020-02-09 DIAGNOSIS — M25512 Pain in left shoulder: Secondary | ICD-10-CM | POA: Diagnosis not present

## 2020-02-18 DIAGNOSIS — Z01419 Encounter for gynecological examination (general) (routine) without abnormal findings: Secondary | ICD-10-CM | POA: Diagnosis not present

## 2020-02-18 DIAGNOSIS — N9089 Other specified noninflammatory disorders of vulva and perineum: Secondary | ICD-10-CM | POA: Diagnosis not present

## 2020-02-18 DIAGNOSIS — Z1231 Encounter for screening mammogram for malignant neoplasm of breast: Secondary | ICD-10-CM | POA: Diagnosis not present

## 2020-02-18 DIAGNOSIS — N904 Leukoplakia of vulva: Secondary | ICD-10-CM | POA: Diagnosis not present

## 2020-02-18 DIAGNOSIS — Z130 Encounter for screening for diseases of the blood and blood-forming organs and certain disorders involving the immune mechanism: Secondary | ICD-10-CM | POA: Diagnosis not present

## 2020-02-18 DIAGNOSIS — N951 Menopausal and female climacteric states: Secondary | ICD-10-CM | POA: Diagnosis not present

## 2020-02-23 DIAGNOSIS — M25612 Stiffness of left shoulder, not elsewhere classified: Secondary | ICD-10-CM | POA: Diagnosis not present

## 2020-02-23 DIAGNOSIS — M6281 Muscle weakness (generalized): Secondary | ICD-10-CM | POA: Diagnosis not present

## 2020-02-23 DIAGNOSIS — M25512 Pain in left shoulder: Secondary | ICD-10-CM | POA: Diagnosis not present

## 2020-02-24 DIAGNOSIS — N9089 Other specified noninflammatory disorders of vulva and perineum: Secondary | ICD-10-CM | POA: Diagnosis not present

## 2020-02-24 DIAGNOSIS — N904 Leukoplakia of vulva: Secondary | ICD-10-CM | POA: Diagnosis not present

## 2020-03-14 ENCOUNTER — Ambulatory Visit: Payer: BC Managed Care – PPO | Admitting: Family Medicine

## 2020-03-15 ENCOUNTER — Encounter: Payer: Self-pay | Admitting: Family Medicine

## 2020-03-15 ENCOUNTER — Other Ambulatory Visit: Payer: Self-pay

## 2020-03-15 ENCOUNTER — Ambulatory Visit: Payer: Self-pay

## 2020-03-15 ENCOUNTER — Ambulatory Visit: Payer: BC Managed Care – PPO | Admitting: Family Medicine

## 2020-03-15 DIAGNOSIS — M25512 Pain in left shoulder: Secondary | ICD-10-CM | POA: Diagnosis not present
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
## 2020-03-16 DIAGNOSIS — M25612 Stiffness of left shoulder, not elsewhere classified: Secondary | ICD-10-CM | POA: Diagnosis not present

## 2020-03-16 DIAGNOSIS — M6281 Muscle weakness (generalized): Secondary | ICD-10-CM | POA: Diagnosis not present

## 2020-03-16 DIAGNOSIS — M25512 Pain in left shoulder: Secondary | ICD-10-CM | POA: Diagnosis not present

## 2020-04-06 DIAGNOSIS — L920 Granuloma annulare: Secondary | ICD-10-CM | POA: Diagnosis not present

## 2020-05-23 ENCOUNTER — Encounter: Payer: Self-pay | Admitting: Family Medicine

## 2020-05-23 DIAGNOSIS — M25512 Pain in left shoulder: Secondary | ICD-10-CM

## 2020-06-26 ENCOUNTER — Other Ambulatory Visit: Payer: Self-pay | Admitting: Family Medicine

## 2020-06-28 ENCOUNTER — Other Ambulatory Visit: Payer: Self-pay

## 2020-06-28 ENCOUNTER — Ambulatory Visit
Admission: RE | Admit: 2020-06-28 | Discharge: 2020-06-28 | Disposition: A | Discharge status: Home / Self Care | Payer: BC Managed Care – PPO | Source: Ambulatory Visit | Attending: Family Medicine | Admitting: Family Medicine

## 2020-06-28 DIAGNOSIS — M25512 Pain in left shoulder: Secondary | ICD-10-CM

## 2020-06-28 DIAGNOSIS — M7552 Bursitis of left shoulder: Secondary | ICD-10-CM | POA: Diagnosis not present

## 2020-06-28 DIAGNOSIS — M19012 Primary osteoarthritis, left shoulder: Secondary | ICD-10-CM | POA: Diagnosis not present

## 2020-06-28 DIAGNOSIS — G8929 Other chronic pain: Secondary | ICD-10-CM | POA: Diagnosis not present

## 2020-06-28 DIAGNOSIS — S46012A Strain of muscle(s) and tendon(s) of the rotator cuff of left shoulder, initial encounter: Secondary | ICD-10-CM | POA: Diagnosis not present

## 2020-06-30 ENCOUNTER — Telehealth: Payer: Self-pay | Admitting: Radiology

## 2020-06-30 ENCOUNTER — Encounter: Payer: Self-pay | Admitting: Family Medicine

## 2020-06-30 NOTE — Telephone Encounter (Signed)
Please see My Chart message sent from patient below and advise.  I sent patient a message advising you were out of the office today and would be back next week.  Hi Dr Hilts. I saw the MRI results. Can you explain that a little more to me?  I'm assuming no interventions are needed.

## 2020-07-03 ENCOUNTER — Telehealth: Payer: Self-pay | Admitting: Family Medicine

## 2020-07-03 DIAGNOSIS — M25512 Pain in left shoulder: Secondary | ICD-10-CM

## 2020-07-03 NOTE — Telephone Encounter (Signed)
MRI shows changes of frozen shoulder, with rotator cuff irritation but no major tears.

## 2020-07-05 NOTE — Addendum Note (Signed)
Addended by: Lillia Carmel on: 07/05/2020 10:04 AM   Modules accepted: Orders

## 2020-07-12 DIAGNOSIS — M9907 Segmental and somatic dysfunction of upper extremity: Secondary | ICD-10-CM | POA: Diagnosis not present

## 2020-07-12 DIAGNOSIS — M436 Torticollis: Secondary | ICD-10-CM | POA: Diagnosis not present

## 2020-07-12 DIAGNOSIS — M25512 Pain in left shoulder: Secondary | ICD-10-CM | POA: Diagnosis not present

## 2020-07-13 DIAGNOSIS — M9907 Segmental and somatic dysfunction of upper extremity: Secondary | ICD-10-CM | POA: Diagnosis not present

## 2020-07-13 DIAGNOSIS — M25512 Pain in left shoulder: Secondary | ICD-10-CM | POA: Diagnosis not present

## 2020-07-13 DIAGNOSIS — M436 Torticollis: Secondary | ICD-10-CM | POA: Diagnosis not present

## 2020-07-18 DIAGNOSIS — M9907 Segmental and somatic dysfunction of upper extremity: Secondary | ICD-10-CM | POA: Diagnosis not present

## 2020-07-18 DIAGNOSIS — M436 Torticollis: Secondary | ICD-10-CM | POA: Diagnosis not present

## 2020-07-18 DIAGNOSIS — M25512 Pain in left shoulder: Secondary | ICD-10-CM | POA: Diagnosis not present

## 2020-07-25 DIAGNOSIS — M9907 Segmental and somatic dysfunction of upper extremity: Secondary | ICD-10-CM | POA: Diagnosis not present

## 2020-07-25 DIAGNOSIS — M25512 Pain in left shoulder: Secondary | ICD-10-CM | POA: Diagnosis not present

## 2020-07-25 DIAGNOSIS — M436 Torticollis: Secondary | ICD-10-CM | POA: Diagnosis not present

## 2020-07-31 DIAGNOSIS — M25512 Pain in left shoulder: Secondary | ICD-10-CM | POA: Diagnosis not present

## 2020-07-31 DIAGNOSIS — M9907 Segmental and somatic dysfunction of upper extremity: Secondary | ICD-10-CM | POA: Diagnosis not present

## 2020-07-31 DIAGNOSIS — M436 Torticollis: Secondary | ICD-10-CM | POA: Diagnosis not present

## 2020-08-01 DIAGNOSIS — M9907 Segmental and somatic dysfunction of upper extremity: Secondary | ICD-10-CM | POA: Diagnosis not present

## 2020-08-01 DIAGNOSIS — M25512 Pain in left shoulder: Secondary | ICD-10-CM | POA: Diagnosis not present

## 2020-08-01 DIAGNOSIS — M436 Torticollis: Secondary | ICD-10-CM | POA: Diagnosis not present

## 2020-08-03 DIAGNOSIS — M9907 Segmental and somatic dysfunction of upper extremity: Secondary | ICD-10-CM | POA: Diagnosis not present

## 2020-08-03 DIAGNOSIS — M436 Torticollis: Secondary | ICD-10-CM | POA: Diagnosis not present

## 2020-08-03 DIAGNOSIS — M25512 Pain in left shoulder: Secondary | ICD-10-CM | POA: Diagnosis not present

## 2020-08-04 DIAGNOSIS — E782 Mixed hyperlipidemia: Secondary | ICD-10-CM | POA: Diagnosis not present

## 2020-08-04 DIAGNOSIS — L920 Granuloma annulare: Secondary | ICD-10-CM | POA: Diagnosis not present

## 2020-08-04 DIAGNOSIS — Z0000 Encounter for general adult medical examination without abnormal findings: Secondary | ICD-10-CM | POA: Diagnosis not present

## 2020-08-07 DIAGNOSIS — M9907 Segmental and somatic dysfunction of upper extremity: Secondary | ICD-10-CM | POA: Diagnosis not present

## 2020-08-07 DIAGNOSIS — M436 Torticollis: Secondary | ICD-10-CM | POA: Diagnosis not present

## 2020-08-07 DIAGNOSIS — M25512 Pain in left shoulder: Secondary | ICD-10-CM | POA: Diagnosis not present

## 2020-08-10 DIAGNOSIS — M9907 Segmental and somatic dysfunction of upper extremity: Secondary | ICD-10-CM | POA: Diagnosis not present

## 2020-08-10 DIAGNOSIS — M436 Torticollis: Secondary | ICD-10-CM | POA: Diagnosis not present

## 2020-08-10 DIAGNOSIS — M25512 Pain in left shoulder: Secondary | ICD-10-CM | POA: Diagnosis not present

## 2020-08-15 DIAGNOSIS — M436 Torticollis: Secondary | ICD-10-CM | POA: Diagnosis not present

## 2020-08-15 DIAGNOSIS — M9907 Segmental and somatic dysfunction of upper extremity: Secondary | ICD-10-CM | POA: Diagnosis not present

## 2020-08-15 DIAGNOSIS — M25512 Pain in left shoulder: Secondary | ICD-10-CM | POA: Diagnosis not present

## 2020-08-16 DIAGNOSIS — M436 Torticollis: Secondary | ICD-10-CM | POA: Diagnosis not present

## 2020-08-16 DIAGNOSIS — M25512 Pain in left shoulder: Secondary | ICD-10-CM | POA: Diagnosis not present

## 2020-08-16 DIAGNOSIS — M9907 Segmental and somatic dysfunction of upper extremity: Secondary | ICD-10-CM | POA: Diagnosis not present

## 2020-08-17 DIAGNOSIS — M9907 Segmental and somatic dysfunction of upper extremity: Secondary | ICD-10-CM | POA: Diagnosis not present

## 2020-08-17 DIAGNOSIS — M436 Torticollis: Secondary | ICD-10-CM | POA: Diagnosis not present

## 2020-08-17 DIAGNOSIS — M25512 Pain in left shoulder: Secondary | ICD-10-CM | POA: Diagnosis not present

## 2020-08-22 DIAGNOSIS — F419 Anxiety disorder, unspecified: Secondary | ICD-10-CM | POA: Diagnosis not present

## 2020-08-22 DIAGNOSIS — Z0000 Encounter for general adult medical examination without abnormal findings: Secondary | ICD-10-CM | POA: Diagnosis not present

## 2020-08-22 DIAGNOSIS — M436 Torticollis: Secondary | ICD-10-CM | POA: Diagnosis not present

## 2020-08-22 DIAGNOSIS — E782 Mixed hyperlipidemia: Secondary | ICD-10-CM | POA: Diagnosis not present

## 2020-08-22 DIAGNOSIS — E538 Deficiency of other specified B group vitamins: Secondary | ICD-10-CM | POA: Diagnosis not present

## 2020-08-22 DIAGNOSIS — M25512 Pain in left shoulder: Secondary | ICD-10-CM | POA: Diagnosis not present

## 2020-08-22 DIAGNOSIS — M9907 Segmental and somatic dysfunction of upper extremity: Secondary | ICD-10-CM | POA: Diagnosis not present

## 2020-08-22 DIAGNOSIS — R5383 Other fatigue: Secondary | ICD-10-CM | POA: Diagnosis not present

## 2020-08-24 DIAGNOSIS — Z79899 Other long term (current) drug therapy: Secondary | ICD-10-CM | POA: Diagnosis not present

## 2020-09-08 DIAGNOSIS — M7502 Adhesive capsulitis of left shoulder: Secondary | ICD-10-CM | POA: Diagnosis not present

## 2020-10-03 DIAGNOSIS — M24112 Other articular cartilage disorders, left shoulder: Secondary | ICD-10-CM | POA: Diagnosis not present

## 2020-10-03 DIAGNOSIS — M7502 Adhesive capsulitis of left shoulder: Secondary | ICD-10-CM | POA: Diagnosis not present

## 2020-10-03 DIAGNOSIS — M7582 Other shoulder lesions, left shoulder: Secondary | ICD-10-CM | POA: Diagnosis not present

## 2020-10-03 DIAGNOSIS — M7542 Impingement syndrome of left shoulder: Secondary | ICD-10-CM | POA: Diagnosis not present

## 2020-10-03 DIAGNOSIS — G8918 Other acute postprocedural pain: Secondary | ICD-10-CM | POA: Diagnosis not present

## 2020-10-04 DIAGNOSIS — M6281 Muscle weakness (generalized): Secondary | ICD-10-CM | POA: Diagnosis not present

## 2020-10-04 DIAGNOSIS — M25612 Stiffness of left shoulder, not elsewhere classified: Secondary | ICD-10-CM | POA: Diagnosis not present

## 2020-10-04 DIAGNOSIS — M25512 Pain in left shoulder: Secondary | ICD-10-CM | POA: Diagnosis not present

## 2020-10-05 DIAGNOSIS — M25512 Pain in left shoulder: Secondary | ICD-10-CM | POA: Diagnosis not present

## 2020-10-05 DIAGNOSIS — M6281 Muscle weakness (generalized): Secondary | ICD-10-CM | POA: Diagnosis not present

## 2020-10-05 DIAGNOSIS — M25612 Stiffness of left shoulder, not elsewhere classified: Secondary | ICD-10-CM | POA: Diagnosis not present

## 2020-10-09 DIAGNOSIS — M25612 Stiffness of left shoulder, not elsewhere classified: Secondary | ICD-10-CM | POA: Diagnosis not present

## 2020-10-09 DIAGNOSIS — M6281 Muscle weakness (generalized): Secondary | ICD-10-CM | POA: Diagnosis not present

## 2020-10-09 DIAGNOSIS — M25512 Pain in left shoulder: Secondary | ICD-10-CM | POA: Diagnosis not present

## 2020-10-11 DIAGNOSIS — M25612 Stiffness of left shoulder, not elsewhere classified: Secondary | ICD-10-CM | POA: Diagnosis not present

## 2020-10-11 DIAGNOSIS — M6281 Muscle weakness (generalized): Secondary | ICD-10-CM | POA: Diagnosis not present

## 2020-10-11 DIAGNOSIS — M25512 Pain in left shoulder: Secondary | ICD-10-CM | POA: Diagnosis not present

## 2020-10-13 DIAGNOSIS — M25512 Pain in left shoulder: Secondary | ICD-10-CM | POA: Diagnosis not present

## 2020-10-13 DIAGNOSIS — M6281 Muscle weakness (generalized): Secondary | ICD-10-CM | POA: Diagnosis not present

## 2020-10-13 DIAGNOSIS — M25612 Stiffness of left shoulder, not elsewhere classified: Secondary | ICD-10-CM | POA: Diagnosis not present

## 2020-10-16 DIAGNOSIS — M25512 Pain in left shoulder: Secondary | ICD-10-CM | POA: Diagnosis not present

## 2020-10-16 DIAGNOSIS — Z4789 Encounter for other orthopedic aftercare: Secondary | ICD-10-CM | POA: Diagnosis not present

## 2020-10-16 DIAGNOSIS — M25612 Stiffness of left shoulder, not elsewhere classified: Secondary | ICD-10-CM | POA: Diagnosis not present

## 2020-10-16 DIAGNOSIS — M6281 Muscle weakness (generalized): Secondary | ICD-10-CM | POA: Diagnosis not present

## 2020-10-20 ENCOUNTER — Ambulatory Visit: Payer: Self-pay | Admitting: Podiatry

## 2020-10-23 ENCOUNTER — Ambulatory Visit (INDEPENDENT_AMBULATORY_CARE_PROVIDER_SITE_OTHER): Payer: BC Managed Care – PPO

## 2020-10-23 ENCOUNTER — Other Ambulatory Visit: Payer: Self-pay

## 2020-10-23 ENCOUNTER — Ambulatory Visit (INDEPENDENT_AMBULATORY_CARE_PROVIDER_SITE_OTHER): Payer: BC Managed Care – PPO | Admitting: Podiatry

## 2020-10-23 DIAGNOSIS — M6281 Muscle weakness (generalized): Secondary | ICD-10-CM | POA: Diagnosis not present

## 2020-10-23 DIAGNOSIS — M79671 Pain in right foot: Secondary | ICD-10-CM

## 2020-10-23 DIAGNOSIS — M722 Plantar fascial fibromatosis: Secondary | ICD-10-CM

## 2020-10-23 DIAGNOSIS — M25612 Stiffness of left shoulder, not elsewhere classified: Secondary | ICD-10-CM | POA: Diagnosis not present

## 2020-10-23 DIAGNOSIS — M25512 Pain in left shoulder: Secondary | ICD-10-CM | POA: Diagnosis not present

## 2020-10-23 NOTE — Patient Instructions (Signed)

## 2020-10-26 DIAGNOSIS — M79671 Pain in right foot: Secondary | ICD-10-CM | POA: Diagnosis not present

## 2020-10-26 DIAGNOSIS — M25612 Stiffness of left shoulder, not elsewhere classified: Secondary | ICD-10-CM | POA: Diagnosis not present

## 2020-10-26 DIAGNOSIS — M6281 Muscle weakness (generalized): Secondary | ICD-10-CM | POA: Diagnosis not present

## 2020-10-26 DIAGNOSIS — M25512 Pain in left shoulder: Secondary | ICD-10-CM | POA: Diagnosis not present

## 2020-10-30 DIAGNOSIS — M79671 Pain in right foot: Secondary | ICD-10-CM | POA: Diagnosis not present

## 2020-10-30 DIAGNOSIS — M6281 Muscle weakness (generalized): Secondary | ICD-10-CM | POA: Diagnosis not present

## 2020-10-30 DIAGNOSIS — M25512 Pain in left shoulder: Secondary | ICD-10-CM | POA: Diagnosis not present

## 2020-10-30 DIAGNOSIS — M25612 Stiffness of left shoulder, not elsewhere classified: Secondary | ICD-10-CM | POA: Diagnosis not present

## 2020-11-01 DIAGNOSIS — M79671 Pain in right foot: Secondary | ICD-10-CM | POA: Diagnosis not present

## 2020-11-01 DIAGNOSIS — M6281 Muscle weakness (generalized): Secondary | ICD-10-CM | POA: Diagnosis not present

## 2020-11-01 DIAGNOSIS — M25512 Pain in left shoulder: Secondary | ICD-10-CM | POA: Diagnosis not present

## 2020-11-01 DIAGNOSIS — M25612 Stiffness of left shoulder, not elsewhere classified: Secondary | ICD-10-CM | POA: Diagnosis not present

## 2020-11-01 NOTE — Progress Notes (Signed)
Subjective:   Patient ID: [name] [name], female   DOB: 54 y.o.   MRN: [id number]   HPI 54 year old female presents the office today with concerns of right heel pain which is been ongoing.  She states that she works 4 days a week and stands and walks a lot at work which causes discomfort.  She states that she has had bone spurs previously.  Denies any recent injury or trauma.  No numbness or tingling.  She said no recent treatment.  She has no other concerns today.   Review of Systems  All other systems reviewed and are negative.  Past Medical History:  Diagnosis Date   Hyperlipemia     Past Surgical History:  Procedure Laterality Date   LASIK       Current Outpatient Medications:    ALPRAZolam (XANAX) 0.25 MG tablet, Take by mouth., Disp: , Rfl:    ascorbic acid (VITAMIN C) 500 MG tablet, Take by mouth., Disp: , Rfl:    Cholecalciferol (VITAMIN D-3 PO), Take by mouth., Disp: , Rfl:    clobetasol cream (TEMOVATE) 0.05 %, clobetasol 0.05 % topical cream  APP A SML AMT TOPICALLY AA 2 TIMES A WK FOR MAINTENANCE, Disp: , Rfl:    Cyanocobalamin (VITAMIN B12 PO), Take by mouth., Disp: , Rfl:    escitalopram (LEXAPRO) 20 MG tablet, Take 20 mg by mouth daily., Disp: , Rfl:    estradiol (ESTRACE) 0.1 MG/GM vaginal cream, Place vaginally., Disp: , Rfl:    folic acid (FOLVITE) 1 MG tablet, Take 1 mg by mouth daily., Disp: , Rfl:    glucosamine-chondroitin 500-400 MG tablet, Take by mouth., Disp: , Rfl:    hydroxychloroquine (PLAQUENIL) 200 MG tablet, Take by mouth daily., Disp: , Rfl:    ibuprofen (ADVIL) 800 MG tablet, Take by mouth., Disp: , Rfl:    methocarbamol (ROBAXIN) 500 MG tablet, Take 500 mg by mouth 4 (four) times daily., Disp: , Rfl:    methotrexate (RHEUMATREX) 2.5 MG tablet, Take 15 mg by mouth once a week., Disp: , Rfl:    ondansetron (ZOFRAN) 4 MG tablet, Take 4 mg by mouth 3 (three) times daily., Disp: , Rfl:    oxyCODONE (OXY IR/ROXICODONE) 5 MG immediate  release tablet, Take 5 mg by mouth every 4 (four) hours., Disp: , Rfl:    pentoxifylline (TRENTAL) 400 MG CR tablet, Take 400 mg by mouth 3 (three) times daily., Disp: , Rfl:    triamcinolone cream (KENALOG) 0.1 %, APPLY TO THE AFFECTED AREA OF SKIN TWICE DAILY. TAPER USE AS ABLE., Disp: , Rfl:    Turmeric Curcumin 500 MG CAPS, Take by mouth daily., Disp: , Rfl:   No Known Allergies       Objective:  Physical Exam  General: AAO x3, NAD  Dermatological: Skin is warm, dry and supple bilateral. There are no open sores, no preulcerative lesions, no rash or signs of infection present.  Vascular: Dorsalis Pedis artery and Posterior Tibial artery pedal pulses are 2/4 bilateral with immedate capillary fill time. There is no pain with calf compression, swelling, warmth, erythema.   Neruologic: Grossly intact via light touch bilateral.  Negative Tinel sign.  Musculoskeletal:  Tenderness to palpation along the plantar medial tubercle of the calcaneus at the insertion of plantar fascia on the right foot. There is no pain along the course of the plantar fascia within the arch of the foot. Plantar fascia appears to be intact. There is no pain with lateral compression of the calcaneus  or pain with vibratory sensation. There is no pain along the course or insertion of the achilles tendon. No other areas of tenderness to bilateral lower extremities.Muscular strength 5/5 in all groups tested bilateral.  Gait: Unassisted, Nonantalgic.       Assessment:   Right heel pain, plantar fasciitis     Plan:  -Treatment options discussed including all alternatives, risks, and complications -Etiology of symptoms were discussed -X-rays were obtained and reviewed with the patient.  There is no evidence of acute fracture or stress fracture identified today -Steroid injection performed.  See procedure note below. -Plantar fascial brace dispensed -Discussed shoe modifications and orthotics -Stretching, icing  daily  Procedure: Injection Tendon/Ligament Discussed alternatives, risks, complications and verbal consent was obtained.  Location: Right plantar fascia at the glabrous junction; medial approach. Skin Prep: Alcohol. Injectate: 0.5cc 0.5% marcaine plain, 0.5 cc 2% lidocaine plain and, 1 cc kenalog 10. Disposition: Patient tolerated procedure well. Injection site dressed with a band-aid.  Post-injection care was discussed and return precautions discussed.   No follow-ups on file.  Vivi Barrack DPM

## 2020-11-08 DIAGNOSIS — M6281 Muscle weakness (generalized): Secondary | ICD-10-CM | POA: Diagnosis not present

## 2020-11-08 DIAGNOSIS — M79671 Pain in right foot: Secondary | ICD-10-CM | POA: Diagnosis not present

## 2020-11-08 DIAGNOSIS — M25512 Pain in left shoulder: Secondary | ICD-10-CM | POA: Diagnosis not present

## 2020-11-08 DIAGNOSIS — M25612 Stiffness of left shoulder, not elsewhere classified: Secondary | ICD-10-CM | POA: Diagnosis not present

## 2020-11-10 DIAGNOSIS — M79671 Pain in right foot: Secondary | ICD-10-CM | POA: Diagnosis not present

## 2020-11-10 DIAGNOSIS — M25612 Stiffness of left shoulder, not elsewhere classified: Secondary | ICD-10-CM | POA: Diagnosis not present

## 2020-11-10 DIAGNOSIS — M6281 Muscle weakness (generalized): Secondary | ICD-10-CM | POA: Diagnosis not present

## 2020-11-10 DIAGNOSIS — M25512 Pain in left shoulder: Secondary | ICD-10-CM | POA: Diagnosis not present

## 2020-11-14 DIAGNOSIS — M6281 Muscle weakness (generalized): Secondary | ICD-10-CM | POA: Diagnosis not present

## 2020-11-14 DIAGNOSIS — M25612 Stiffness of left shoulder, not elsewhere classified: Secondary | ICD-10-CM | POA: Diagnosis not present

## 2020-11-14 DIAGNOSIS — M25512 Pain in left shoulder: Secondary | ICD-10-CM | POA: Diagnosis not present

## 2020-11-14 DIAGNOSIS — M79671 Pain in right foot: Secondary | ICD-10-CM | POA: Diagnosis not present

## 2020-11-16 DIAGNOSIS — M79671 Pain in right foot: Secondary | ICD-10-CM | POA: Diagnosis not present

## 2020-11-16 DIAGNOSIS — M25612 Stiffness of left shoulder, not elsewhere classified: Secondary | ICD-10-CM | POA: Diagnosis not present

## 2020-11-16 DIAGNOSIS — M6281 Muscle weakness (generalized): Secondary | ICD-10-CM | POA: Diagnosis not present

## 2020-11-16 DIAGNOSIS — M25512 Pain in left shoulder: Secondary | ICD-10-CM | POA: Diagnosis not present

## 2020-11-20 DIAGNOSIS — M79671 Pain in right foot: Secondary | ICD-10-CM | POA: Diagnosis not present

## 2020-11-20 DIAGNOSIS — M6281 Muscle weakness (generalized): Secondary | ICD-10-CM | POA: Diagnosis not present

## 2020-11-20 DIAGNOSIS — M25512 Pain in left shoulder: Secondary | ICD-10-CM | POA: Diagnosis not present

## 2020-11-20 DIAGNOSIS — M25612 Stiffness of left shoulder, not elsewhere classified: Secondary | ICD-10-CM | POA: Diagnosis not present

## 2020-11-27 ENCOUNTER — Ambulatory Visit: Payer: BC Managed Care – PPO | Admitting: Podiatry

## 2020-11-27 ENCOUNTER — Other Ambulatory Visit: Payer: Self-pay

## 2020-11-27 ENCOUNTER — Inpatient Hospital Stay: Payer: BC Managed Care – PPO | Attending: Hematology

## 2020-11-27 DIAGNOSIS — Z23 Encounter for immunization: Secondary | ICD-10-CM | POA: Diagnosis not present

## 2020-11-27 NOTE — Progress Notes (Signed)
   Covid-19 Vaccination Clinic  Name:  Avereigh Spainhower    MRN: [id number] DOB: 1966/07/20  11/27/2020  Ms. Matzen was observed post Covid-19 immunization for 15 minutes without incident. She was provided with Vaccine Information Sheet and instruction to access the V-Safe system.   Ms. Dunnigan was instructed to call 911 with any severe reactions post vaccine: Marland Kitchen Difficulty breathing  . Swelling of face and throat  . A fast heartbeat  . A bad rash all over body  . Dizziness and weakness   Immunizations Administered    Name Date Dose VIS Date Route   Pfizer COVID-19 Vaccine 11/27/2020  3:32 PM 0.3 mL 09/27/2020 Intramuscular   Manufacturer: [employer], [employer]   Lot: [id number]   NDC: [id number]

## 2020-11-28 DIAGNOSIS — M25512 Pain in left shoulder: Secondary | ICD-10-CM | POA: Diagnosis not present

## 2020-11-28 DIAGNOSIS — M25612 Stiffness of left shoulder, not elsewhere classified: Secondary | ICD-10-CM | POA: Diagnosis not present

## 2020-11-28 DIAGNOSIS — M79671 Pain in right foot: Secondary | ICD-10-CM | POA: Diagnosis not present

## 2020-11-28 DIAGNOSIS — M6281 Muscle weakness (generalized): Secondary | ICD-10-CM | POA: Diagnosis not present

## 2020-12-05 DIAGNOSIS — L920 Granuloma annulare: Secondary | ICD-10-CM | POA: Diagnosis not present

## 2020-12-05 DIAGNOSIS — Z79899 Other long term (current) drug therapy: Secondary | ICD-10-CM | POA: Diagnosis not present

## 2020-12-12 DIAGNOSIS — M6281 Muscle weakness (generalized): Secondary | ICD-10-CM | POA: Diagnosis not present

## 2020-12-12 DIAGNOSIS — M79671 Pain in right foot: Secondary | ICD-10-CM | POA: Diagnosis not present

## 2020-12-12 DIAGNOSIS — M25512 Pain in left shoulder: Secondary | ICD-10-CM | POA: Diagnosis not present

## 2020-12-12 DIAGNOSIS — M25612 Stiffness of left shoulder, not elsewhere classified: Secondary | ICD-10-CM | POA: Diagnosis not present

## 2020-12-14 DIAGNOSIS — L920 Granuloma annulare: Secondary | ICD-10-CM | POA: Diagnosis not present

## 2020-12-14 DIAGNOSIS — D485 Neoplasm of uncertain behavior of skin: Secondary | ICD-10-CM | POA: Diagnosis not present

## 2021-02-09 IMAGING — MR MRI LUMBAR SPINE WITHOUT CONTRAST
4 of 6 series · 18 of 48 positions shown · non-contrast
Comparison: None.

CLINICAL DATA: Lower back pain greater on the right. Pain radiates
to the right hip

EXAM:
MRI LUMBAR SPINE WITHOUT CONTRAST
TECHNIQUE: Multiplanar, multisequence MR imaging of the lumbar spine was
performed. No intravenous contrast was administered.

[Series 6: T2 · sagittal · 4.0mm · 0.73mm/px · 5 of 15 slices shown (1 of 2)]
[im 1/15]
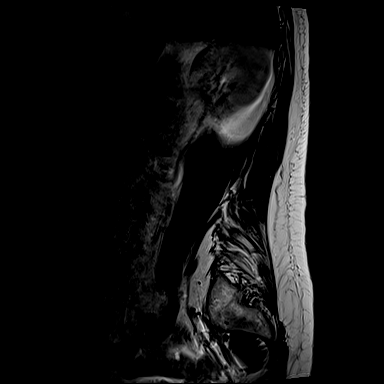
[im 4/15]
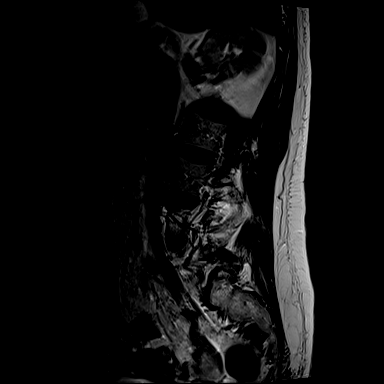
[im 8/15]
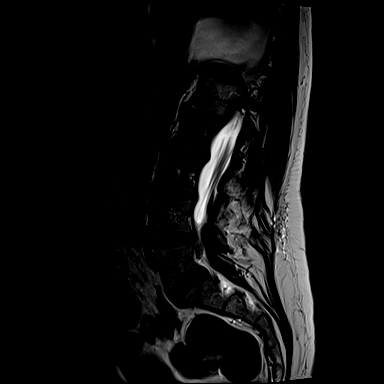
[im 11/15]
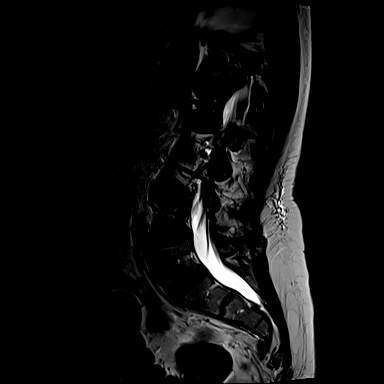
[im 15/15]
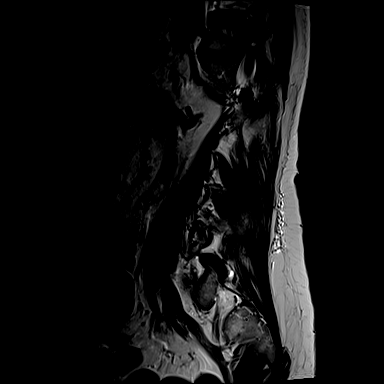

[Series 7: T1 · sagittal · 4.0mm · 0.73mm/px · 3 of 15 slices shown (1 of 2)]
[im 1/15]
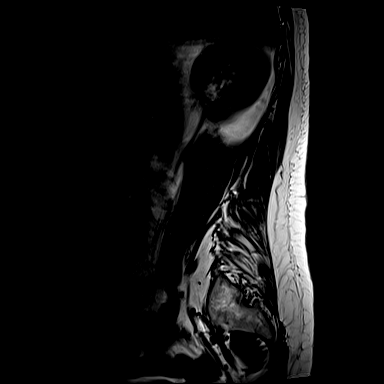
[im 8/15]
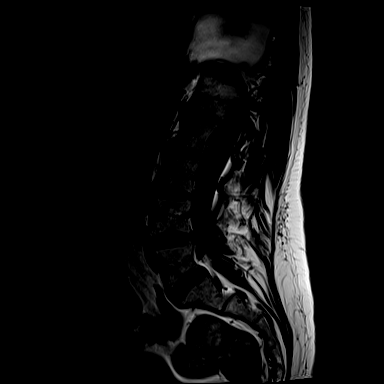
[im 15/15]
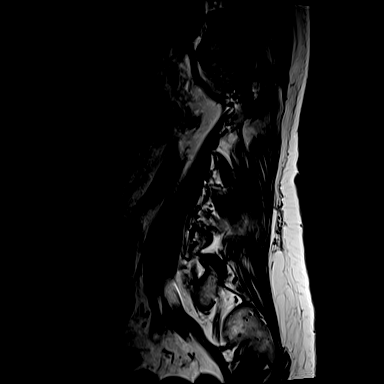

[Series 14: T2 · axial · 4.0mm · 0.28mm/px · z∈[-125,+68]mm · 7 of 43 slices shown (2 of 2)]
[im 1/43]
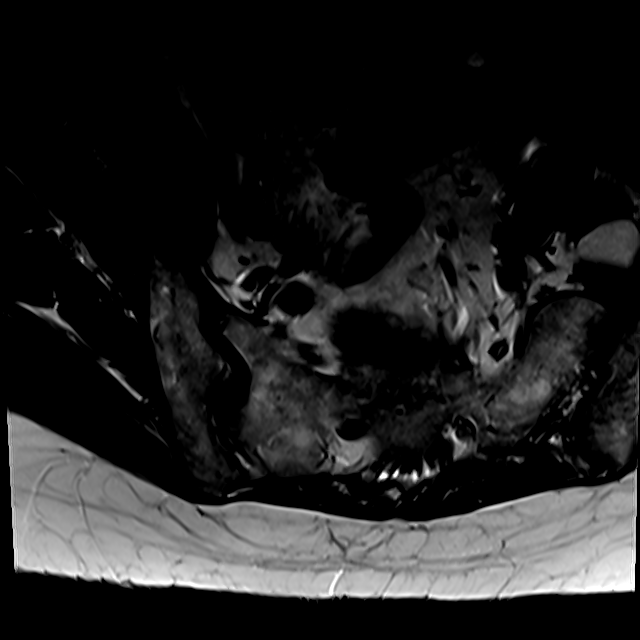
[im 7/43]
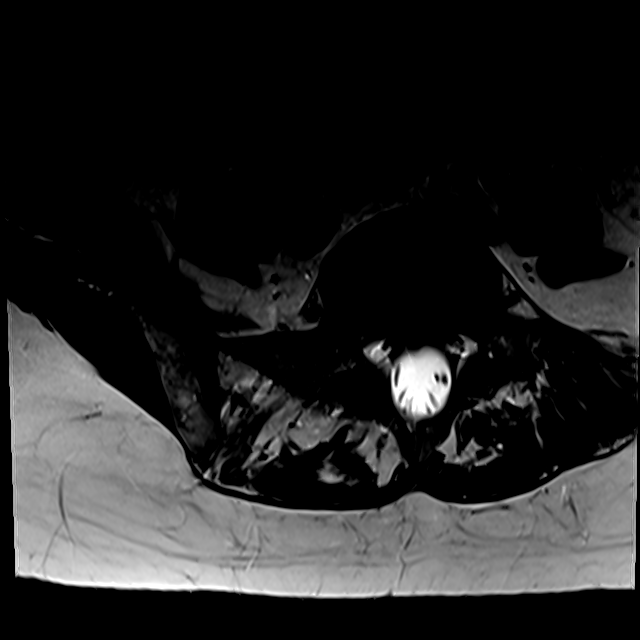
[im 13/43]
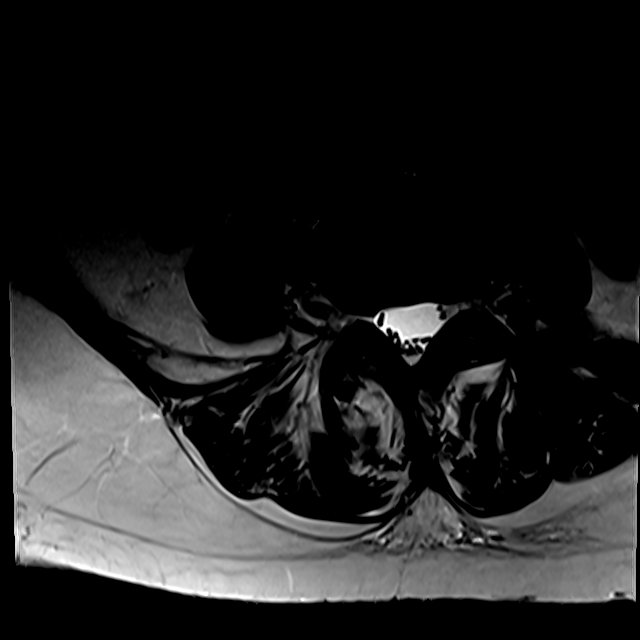
[im 20/43]
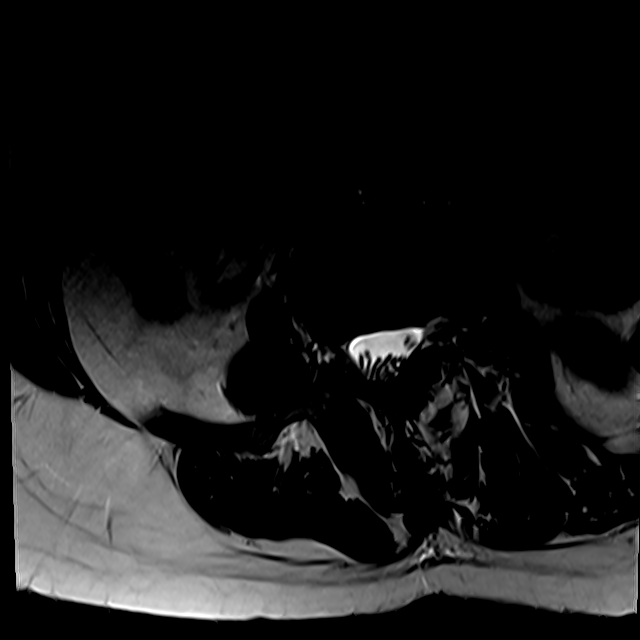
[im 23/43]
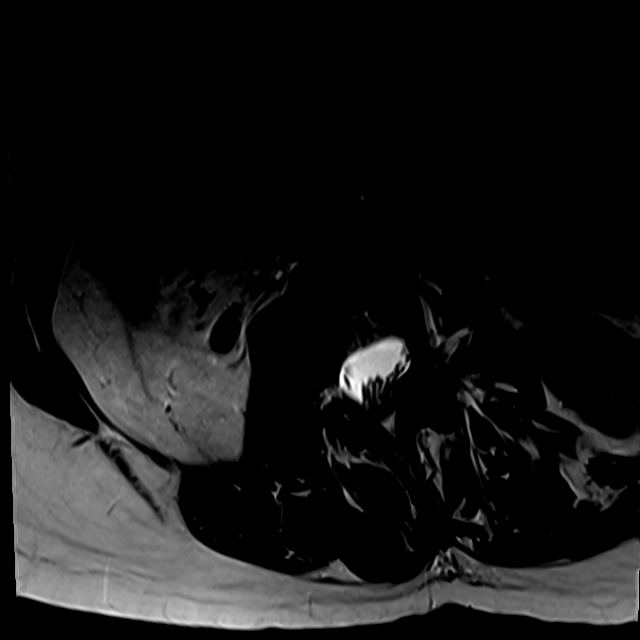
[im 30/43]
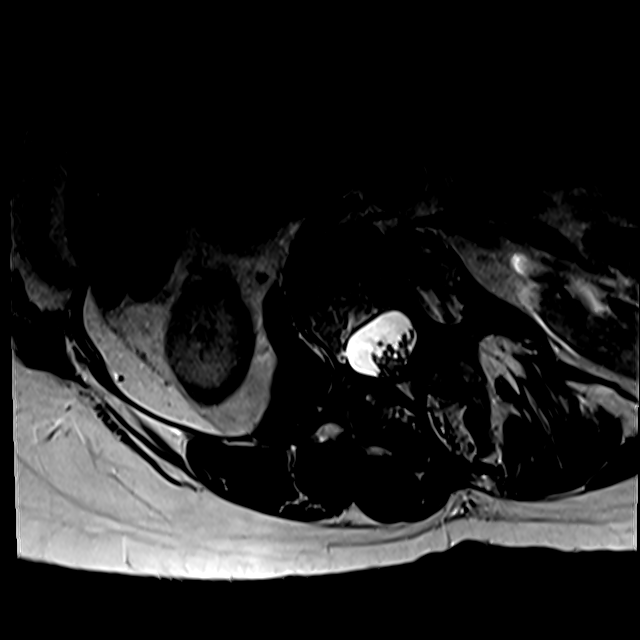
[im 36/43]
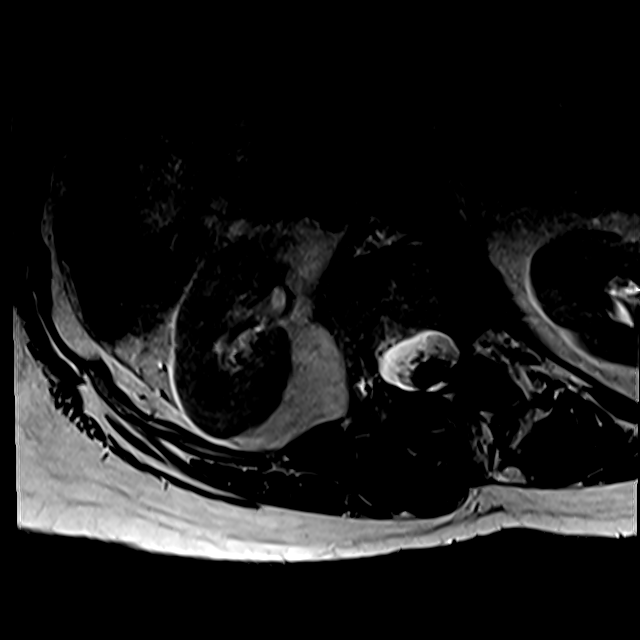

[Series 100: T1 · axial · 4.0mm · 0.28mm/px · z∈[-96,+68]mm · 3 of 43 slices shown (2 of 2)]
[im 7/43]
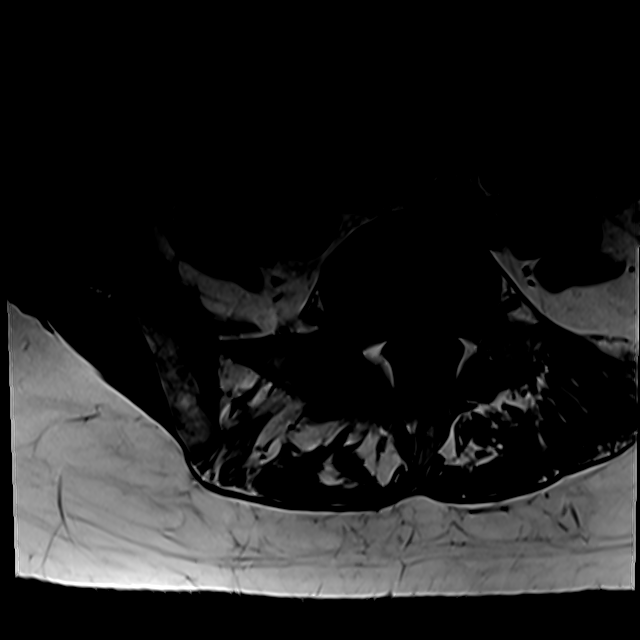
[im 23/43]
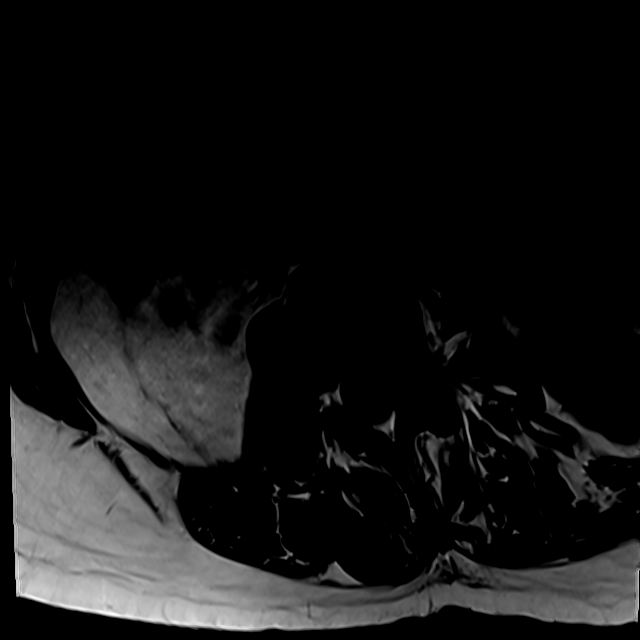
[im 36/43]
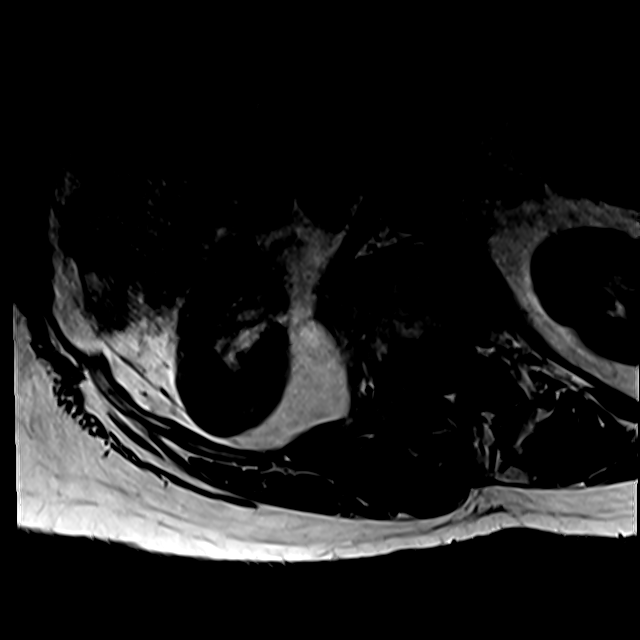

[18 of 48 positions shown; findings below may reference images not displayed]

FINDINGS: Segmentation:  5 lumbar vertebrae based on the available coverage.

Alignment:  Dextroscoliosis centered at L1-2

Vertebrae: Marrow edema about the right L4-5 and even greater about
the right L5-S1 facets. No fracture, discitis, or aggressive bone
lesion.

Conus medullaris and cauda equina: Conus extends to the L1-2 level.
Conus and cauda equina appear normal.

Paraspinal and other soft tissues: Negative

Disc levels:

L1-L2: Asymmetric left-sided disc narrowing and bulge. No
impingement

L2-L3: Asymmetric left-sided disc narrowing with mild bulging. No
impingement

L3-L4: Mild disc narrowing and bulging.  No impingement

L4-L5: Degenerative facet spurring asymmetric to the right. There is
mild disc narrowing and bulging. No impingement

L5-S1:Degenerative facet spurring asymmetric to the right where
there is bulky facet arthropathy. Mild disc bulging. No impingement
IMPRESSION: 1. Dextroscoliosis with asymmetric disc narrowing and right-sided
L4-5 and L5-S1 facet arthritis. There is active marrow edema at the
levels of arthritis, which may be symptomatic.
2. No neural compression.

## 2021-02-26 DIAGNOSIS — Z1389 Encounter for screening for other disorder: Secondary | ICD-10-CM | POA: Diagnosis not present

## 2021-02-26 DIAGNOSIS — Z1231 Encounter for screening mammogram for malignant neoplasm of breast: Secondary | ICD-10-CM | POA: Diagnosis not present

## 2021-02-26 DIAGNOSIS — N904 Leukoplakia of vulva: Secondary | ICD-10-CM | POA: Diagnosis not present

## 2021-02-26 DIAGNOSIS — Z130 Encounter for screening for diseases of the blood and blood-forming organs and certain disorders involving the immune mechanism: Secondary | ICD-10-CM | POA: Diagnosis not present

## 2021-02-26 DIAGNOSIS — Z01419 Encounter for gynecological examination (general) (routine) without abnormal findings: Secondary | ICD-10-CM | POA: Diagnosis not present

## 2021-03-05 DIAGNOSIS — L920 Granuloma annulare: Secondary | ICD-10-CM | POA: Diagnosis not present

## 2021-04-06 DIAGNOSIS — F4322 Adjustment disorder with anxiety: Secondary | ICD-10-CM | POA: Diagnosis not present

## 2021-05-07 ENCOUNTER — Telehealth: Payer: BC Managed Care – PPO | Admitting: Physician Assistant

## 2021-05-07 DIAGNOSIS — W57XXXA Bitten or stung by nonvenomous insect and other nonvenomous arthropods, initial encounter: Secondary | ICD-10-CM | POA: Diagnosis not present

## 2021-05-07 DIAGNOSIS — R21 Rash and other nonspecific skin eruption: Secondary | ICD-10-CM

## 2021-05-07 MED ORDER — PREDNISONE 10 MG (21) PO TBPK
ORAL_TABLET | ORAL | 0 refills | Status: DC
Start: 2021-05-07 — End: 2022-03-23

## 2021-05-07 NOTE — Progress Notes (Signed)
E Visit for Rash  We are sorry that you are not feeling well. Here is how we plan to help!  It appears you are having an allergic reaction to a possible insect bite.   I will send in the following prescription:  Prednisone 10 mg daily for 6 days (see taper instructions below)  Directions for 6 day taper: Day 1: 2 tablets before breakfast, 1 after both lunch & dinner and 2 at bedtime Day 2: 1 tab before breakfast, 1 after both lunch & dinner and 2 at bedtime Day 3: 1 tab at each meal & 1 at bedtime Day 4: 1 tab at breakfast, 1 at lunch, 1 at bedtime Day 5: 1 tab at breakfast & 1 tab at bedtime Day 6: 1 tab at breakfast   HOME CARE:   Take cool showers and avoid direct sunlight.  Apply cool compress or wet dressings.  Take a bath in an oatmeal bath.  Sprinkle content of one Aveeno packet under running faucet with comfortably warm water.  Bathe for 15-20 minutes, 1-2 times daily.  Pat dry with a towel. Do not rub the rash.  Use hydrocortisone cream.  Take an antihistamine like Benadryl for widespread rashes that itch.  The adult dose of Benadryl is 25-50 mg by mouth 4 times daily.  Caution:  This type of medication may cause sleepiness.  Do not drink alcohol, drive, or operate dangerous machinery while taking antihistamines.  Do not take these medications if you have prostate enlargement.  Read package instructions thoroughly on all medications that you take.  GET HELP RIGHT AWAY IF:   Symptoms don't go away after treatment.  Severe itching that persists.  If you rash spreads or swells.  If you rash begins to smell.  If it blisters and opens or develops a yellow-brown crust.  You develop a fever.  You have a sore throat.  You become short of breath.  MAKE SURE YOU:  Understand these instructions. Will watch your condition. Will get help right away if you are not doing well or get worse.  Thank you for choosing an e-visit. Your e-visit answers were reviewed by  a board certified advanced clinical practitioner to complete your personal care plan. Depending upon the condition, your plan could have included both over the counter or prescription medications. Please review your pharmacy choice. Be sure that the pharmacy you have chosen is open so that you can pick up your prescription now.  If there is a problem you may message your provider in MyChart to have the prescription routed to another pharmacy. Your safety is important to [place]. If you have drug allergies check your prescription carefully.  For the next 24 hours, you can use MyChart to ask questions about today's visit, request a non-urgent call back, or ask for a work or school excuse from your e-visit provider. You will get an email in the next two days asking about your experience. I hope that your e-visit has been valuable and will speed your recovery.  I provided 6 minutes of non face-to-face time during this encounter for chart review and documentation.

## 2021-07-09 DIAGNOSIS — H40013 Open angle with borderline findings, low risk, bilateral: Secondary | ICD-10-CM | POA: Diagnosis not present

## 2021-07-24 DIAGNOSIS — L720 Epidermal cyst: Secondary | ICD-10-CM | POA: Diagnosis not present

## 2022-03-23 ENCOUNTER — Emergency Department
Admission: EM | Admit: 2022-03-23 | Discharge: 2022-03-23 | Disposition: A | Discharge status: Home / Self Care | Payer: BC Managed Care – PPO | Source: Home / Self Care | Attending: Family Medicine | Admitting: Family Medicine

## 2022-03-23 ENCOUNTER — Encounter: Payer: Self-pay | Admitting: Emergency Medicine

## 2022-03-23 DIAGNOSIS — J019 Acute sinusitis, unspecified: Secondary | ICD-10-CM | POA: Diagnosis not present

## 2022-03-23 DIAGNOSIS — B9689 Other specified bacterial agents as the cause of diseases classified elsewhere: Secondary | ICD-10-CM

## 2022-03-23 MED ORDER — FLUTICASONE PROPIONATE 50 MCG/ACT NA SUSP: 2.0000 | Freq: Every day | NASAL | 0 refills | Status: AC

## 2022-03-23 MED ORDER — AMOXICILLIN-POT CLAVULANATE 875-125 MG PO TABS: 1.0000 | ORAL_TABLET | Freq: Two times a day (BID) | ORAL | 0 refills | Status: DC

## 2022-03-23 NOTE — ED Provider Notes (Signed)
?[facility] ? ? ? ?CSN: [id number] ?Arrival date & time: 03/23/22  2376 ? ? ?  ? ?History   ?Chief Complaint ?Chief Complaint  ?Patient presents with  ? Cough  ? ? ?HPI ?[name] [name] is a 56 y.o. female.  ? ?HPI ? ?Patient has sinus pressure and pain, postnasal drip, fatigue, purulent nasal drainage for the last 6 days.  She states it is getting worse.  Today the sore throat and fatigue is worse than it has been.  She did a home COVID test.  It was negative.  She has been taking over-the-counter medicines without improvement.  She does have allergies ? ?Past Medical History:  ?Diagnosis Date  ? Hyperlipemia   ? ? ?Patient Active Problem List  ? Diagnosis Date Noted  ? Cyst of breast 12/22/2019  ? Dysmenorrhea 12/22/2019  ? Menorrhagia 12/22/2019  ? Microscopic hematuria 12/22/2019  ? 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 Hyperlipidemia 03/15/2019  ? Pain of left hand 05/06/2018  ? Scoliosis 07/06/2014  ? Depression 09/29/2013  ? ? ?Past Surgical History:  ?Procedure Laterality Date  ? LASIK    ? SHOULDER SURGERY Left   ? ? ?OB History   ?No obstetric history on file. ?  ? ? ? ?Home Medications   ? ?Prior to Admission medications   ?Medication Sig Start Date End Date Taking? Authorizing Provider  ?Adalimumab (HUMIRA PEN) 40 MG/0.4ML PNKT Humira(CF) Pen 40 mg/0.4 mL subcutaneous kit   Yes [provider]  ?ALPRAZolam Duanne Moron) 0.25 MG tablet Take by mouth. 08/03/18  Yes [provider]  ?amoxicillin-clavulanate (AUGMENTIN) 875-125 MG tablet Take 1 tablet by mouth every 12 (twelve) hours. 03/23/22  Yes Raylene Everts, MD  ?Cholecalciferol (VITAMIN D-3 PO) Take by mouth.   Yes [provider]  ?Cyanocobalamin (VITAMIN B12 PO) Take by mouth.   Yes [provider]  ?escitalopram (LEXAPRO) 20 MG tablet Take 20 mg by mouth daily.   Yes [provider]  ?fluticasone (FLONASE) 50 MCG/ACT nasal spray Place 2 sprays into both nostrils daily. 03/23/22  Yes Raylene Everts, MD   ?folic acid (FOLVITE) 1 MG tablet Take 1 mg by mouth daily. 09/07/20  Yes [provider]  ?glucosamine-chondroitin 500-400 MG tablet Take by mouth.   Yes [provider]  ?hydroxychloroquine (PLAQUENIL) 200 MG tablet Take by mouth daily.   Yes [provider]  ?ibuprofen (ADVIL) 800 MG tablet Take by mouth.   Yes [provider]  ? ? ?Family History ?Family History  ?Problem Relation Age of Onset  ? Renal Disease Father   ? Heart disease Father   ? ? ?Social History ?Social History  ? ?Tobacco Use  ? Smoking status: Never  ? Smokeless tobacco: Never  ?Substance Use Topics  ? Alcohol use: No  ? Drug use: No  ? ? ? ?Allergies   ?Patient has no known allergies. ? ? ?Review of Systems ?Review of Systems ?See HPI ? ?Physical Exam ?Triage Vital Signs ?ED Triage Vitals [03/23/22 0848]  ?Enc Vitals Group  ?   BP 98/68  ?   Pulse Rate 72  ?   Resp 18  ?   Temp 98.7 ?F (37.1 ?C)  ?   Temp Source Oral  ?   SpO2 97 %  ?   Weight 128 lb (58.1 kg)  ?   Height 5' 1"  (1.549 m)  ?   Head Circumference   ?   Peak Flow   ?   Pain  Score 5  ?   Pain Loc   ?   Pain Edu?   ?   Excl. in [place]?   ? ?No data found. ? ?Updated Vital Signs ?BP 98/68 (BP Location: Right Arm)   Pulse 72   Temp 98.7 ?F (37.1 ?C) (Oral)   Resp 18   Ht 5' 1"  (1.549 m)   Wt 58.1 kg   LMP 01/21/2018   SpO2 97%   BMI 24.19 kg/m?  ? ?   ? ?Physical Exam ?Constitutional:   ?   General: She is not in acute distress. ?   Appearance: She is well-developed. She is ill-appearing.  ?HENT:  ?   Head: Normocephalic and atraumatic.  ?   Right Ear: Tympanic membrane and external ear normal.  ?   Left Ear: Tympanic membrane and ear canal normal.  ?   Nose: Congestion and rhinorrhea present.  ?   Mouth/Throat:  ?   Pharynx: Posterior oropharyngeal erythema present.  ?Eyes:  ?   Conjunctiva/sclera: Conjunctivae normal.  ?   Pupils: Pupils are equal, round, and reactive to light.  ?Cardiovascular:  ?   Rate and Rhythm: Normal rate and regular  rhythm.  ?   Heart sounds: Normal heart sounds.  ?Pulmonary:  ?   Effort: Pulmonary effort is normal. No respiratory distress.  ?   Breath sounds: No rhonchi.  ?Abdominal:  ?   General: There is no distension.  ?   Palpations: Abdomen is soft.  ?Musculoskeletal:     ?   General: Normal range of motion.  ?   Cervical back: Normal range of motion.  ?Lymphadenopathy:  ?   Cervical: Cervical adenopathy present.  ?Skin: ?   General: Skin is warm and dry.  ?Neurological:  ?   Mental Status: She is alert.  ?Psychiatric:     ?   Mood and Affect: Mood normal.     ?   Behavior: Behavior normal.  ? ? ? ?UC Treatments / Results  ?Labs ?(all labs ordered are listed, but only abnormal results are displayed) ?Labs Reviewed - No data to display ? ?EKG ? ? ?Radiology ?No results found. ? ?Procedures ?Procedures (including critical care time) ? ?Medications Ordered in UC ?Medications - No data to display ? ?Initial Impression / Assessment and Plan / UC Course  ?I have reviewed the triage vital signs and the nursing notes. ? ?Pertinent labs & imaging results that were available during my care of the patient were reviewed by me and considered in my medical decision making (see chart for details). ? ?  ? ?Sinuses acutely tender.  Not improving with conservative management.  We will add antibiotics ?Final Clinical Impressions(s) / UC Diagnoses  ? ?Final diagnoses:  ?Acute bacterial sinusitis  ? ? ? ?Discharge Instructions   ? ?  ?Take the Augmentin 2 times a day with food ?Take 2 doses today ?Use Flonase 2 times a day for the first couple of days then 1 a day until symptoms improve ?Continue Mucinex DM ?Consider taking a probiotic while on Augmentin to protect your stomach ?Call for problems ? ? ?ED Prescriptions   ? ? Medication Sig Dispense Auth. Provider  ? amoxicillin-clavulanate (AUGMENTIN) 875-125 MG tablet Take 1 tablet by mouth every 12 (twelve) hours. 14 tablet Raylene Everts, MD  ? fluticasone [facility]) 50 MCG/ACT nasal  spray Place 2 sprays into both nostrils daily. 16 g Raylene Everts, MD  ? ?  ? ?PDMP not reviewed this encounter. ?  ?  Raylene Everts, MD ?03/23/22 1010 ? ?

## 2022-03-23 NOTE — ED Triage Notes (Signed)
Patient c/o fatigue, headache, sinus pressure, productive cough and sore throat x 6 days.  Patient has been taken Mucinex and Ibuprofen.  Home COVID test was negative. ?

## 2022-03-23 NOTE — Discharge Instructions (Signed)
Take the Augmentin 2 times a day with food ?Take 2 doses today ?Use Flonase 2 times a day for the first couple of days then 1 a day until symptoms improve ?Continue Mucinex DM ?Consider taking a probiotic while on Augmentin to protect your stomach ?Call for problems ?

## 2023-06-12 ENCOUNTER — Telehealth: Payer: Self-pay

## 2023-06-12 ENCOUNTER — Ambulatory Visit
Admission: EM | Admit: 2023-06-12 | Discharge: 2023-06-12 | Disposition: A | Discharge status: Home / Self Care | Payer: BC Managed Care – PPO | Attending: Family Medicine | Admitting: Family Medicine

## 2023-06-12 DIAGNOSIS — J309 Allergic rhinitis, unspecified: Secondary | ICD-10-CM | POA: Diagnosis not present

## 2023-06-12 DIAGNOSIS — R0981 Nasal congestion: Secondary | ICD-10-CM | POA: Diagnosis not present

## 2023-06-12 DIAGNOSIS — B9689 Other specified bacterial agents as the cause of diseases classified elsewhere: Secondary | ICD-10-CM | POA: Diagnosis not present

## 2023-06-12 DIAGNOSIS — J019 Acute sinusitis, unspecified: Secondary | ICD-10-CM

## 2023-06-12 MED ORDER — FEXOFENADINE HCL 180 MG PO TABS: 180.0000 mg | ORAL_TABLET | Freq: Every day | ORAL | 0 refills | Status: AC

## 2023-06-12 MED ORDER — FEXOFENADINE HCL 180 MG PO TABS: 180.0000 mg | ORAL_TABLET | Freq: Every day | ORAL | 0 refills | Status: DC

## 2023-06-12 MED ORDER — PREDNISONE 20 MG PO TABS: ORAL_TABLET | ORAL | 0 refills | Status: AC

## 2023-06-12 MED ORDER — AMOXICILLIN-POT CLAVULANATE 875-125 MG PO TABS: 1.0000 | ORAL_TABLET | Freq: Two times a day (BID) | ORAL | 0 refills | Status: AC

## 2023-06-12 MED ORDER — PREDNISONE 20 MG PO TABS: ORAL_TABLET | ORAL | 0 refills | Status: DC

## 2023-06-12 MED ORDER — AMOXICILLIN-POT CLAVULANATE 875-125 MG PO TABS: 1.0000 | ORAL_TABLET | Freq: Two times a day (BID) | ORAL | 0 refills | Status: DC

## 2023-06-12 NOTE — ED Triage Notes (Signed)
Pt presents to uc with co of scratchy raspy throat for one week with new onset of sinus pain and congestion over the last few days. She is concerned for sinus infection has used musinex and motrin. Covid at home neg.

## 2023-06-12 NOTE — Discharge Instructions (Addendum)
Instructed patient to take medications as directed with food to completion.  Advised patient to take prednisone and Allegra with first dose of Augmentin for the next 5 of 10 days.  Advised may use Allegra as needed afterwards for concurrent postnasal drip/drainage.  Encouraged increase daily water intake to 64 ounces per day while taking these medications.  Advised if symptoms worsen and/or unresolved please follow-up with PCP or here for further evaluation.

## 2023-06-12 NOTE — ED Provider Notes (Signed)
[name]    CSN: [id number] Arrival date & time: 06/12/23  4098      History   Chief Complaint Chief Complaint  Patient presents with   URI    HPI [name] [name] is a 57 y.o. female.   HPI 57 year old female presents with scratchy raspy throat for 1 week, sinus pain and congestion over the past 3 days.  Patient is concerned with sinus infection and reports home COVID-19 was negative.  PMH significant for scoliosis and HLD.  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      Home Medications    Prior to Admission medications   Medication Sig Start Date End Date Taking? Authorizing Provider  amoxicillin-clavulanate (AUGMENTIN) 875-125 MG tablet Take 1 tablet by mouth 2 (two) times daily for 10 days. 06/12/23 06/22/23 Yes Trevor Iha, FNP  fexofenadine [facility] ALLERGY) 180 MG tablet Take 1 tablet (180 mg total) by mouth daily for 15 days. 06/12/23 06/27/23 Yes Trevor Iha, FNP  predniSONE (DELTASONE) 20 MG tablet Take 3 tabs PO daily x 5 days. 06/12/23  Yes Trevor Iha, FNP  Adalimumab (HUMIRA PEN) 40 MG/0.4ML PNKT Humira(CF) Pen 40 mg/0.4 mL subcutaneous kit    [provider]  ALPRAZolam Prudy Feeler) 0.25 MG tablet Take by mouth. 08/03/18   [provider]  Cholecalciferol (VITAMIN D-3 PO) Take by mouth.    [provider]  Cyanocobalamin (VITAMIN B12 PO) Take by mouth.    [provider]  escitalopram (LEXAPRO) 20 MG tablet Take 20 mg by mouth daily.    [provider]  fluticasone (FLONASE) 50 MCG/ACT nasal spray Place 2 sprays into both nostrils daily. 03/23/22   Eustace Moore, MD  folic acid (FOLVITE) 1 MG tablet Take 1 mg by mouth daily. 09/07/20   [provider]  hydroxychloroquine (PLAQUENIL) 200 MG tablet Take by mouth daily.    [provider]  ibuprofen (ADVIL) 800 MG tablet Take by mouth.    [provider]    Family History Family History  Problem Relation Age of Onset   Renal Disease Father    Heart disease Father     Social History Social History   Tobacco Use   Smoking status: Never   Smokeless tobacco: Never  Substance Use Topics   Alcohol use: No   Drug use: No     Allergies   Patient has no known allergies.   Review of Systems Review of Systems  HENT:  Positive for congestion and sore throat.   All other systems reviewed and are negative.    Physical Exam Triage Vital Signs ED Triage Vitals  Enc Vitals Group     BP 06/12/23 0838 109/70     Pulse Rate 06/12/23 0838 66     Resp 06/12/23 0838 16     Temp 06/12/23 0838 98.2 F (36.8 C)     Temp src --      SpO2 06/12/23 0838 98 %  Weight --      Height --      Head Circumference --      Peak Flow --      Pain Score 06/12/23 0836 3     Pain Loc --      Pain Edu? --      Excl. in GC? --    No data found.  Updated Vital Signs BP 109/70   Pulse 66   Temp 98.2 F (36.8 C)   Resp 16   LMP 01/21/2018   SpO2 98%      Physical Exam Vitals and nursing note reviewed.  Constitutional:      General: She is not in acute distress.    Appearance: Normal appearance. She is normal weight. She is ill-appearing.  HENT:     Head: Normocephalic and atraumatic.     Right Ear: Tympanic membrane and external ear normal.     Left Ear: Tympanic membrane and external ear normal.     Ears:     Comments: Significant eustachian tube dysfunction noted bilaterally    Nose:     Right Sinus: Maxillary sinus tenderness present.      Left Sinus: Maxillary sinus tenderness present.     Comments: Turbinates are erythematous/edematous    Mouth/Throat:     Mouth: Mucous membranes are moist.     Pharynx: Oropharynx is clear.     Comments: Significant amount of clear drainage of posterior oropharynx noted Eyes:     Extraocular Movements: Extraocular movements intact.     Conjunctiva/sclera: Conjunctivae normal.     Pupils: Pupils are equal, round, and reactive to light.  Cardiovascular:     Rate and Rhythm: Normal rate and regular rhythm.     Pulses: Normal pulses.     Heart sounds: Normal heart sounds.  Pulmonary:     Effort: Pulmonary effort is normal.     Breath sounds: Normal breath sounds. No wheezing, rhonchi or rales.  Musculoskeletal:        General: Normal range of motion.     Cervical back: Normal range of motion and neck supple. No tenderness.  Lymphadenopathy:     Cervical: No cervical adenopathy.  Skin:    General: Skin is warm and dry.  Neurological:     General: No focal deficit present.     Mental Status: She is alert and oriented to person, place, and time. Mental status is at baseline.  Psychiatric:        Mood and Affect: Mood normal.        Behavior: Behavior normal.      UC Treatments / Results  Labs (all labs ordered are listed, but only abnormal results are displayed) Labs Reviewed - No data to display  EKG   Radiology No results found.  Procedures Procedures (including critical care time)  Medications Ordered in UC Medications - No data to display  Initial Impression / Assessment and Plan / UC Course  I have reviewed the triage vital signs and the nursing notes.  Pertinent labs & imaging results that were available during my care of the patient were reviewed by me and considered in my medical decision making (see chart for details).     MDM: 1.  Acute bacterial rhinosinusitis-Rx'd Augmentin 875/125 mg twice daily x 10 days; 2.  Congestion of nasal sinus-Rx'd prednisone  60 mg daily x 5 days; 3.  Allergic rhinitis, unspecified seasonality, unspecified trigger-Rx'd Allegra 180 mg daily x 5 days, then as needed for  concurrent postnasal drainage/drip/rhinitis. Instructed patient to take medications as directed with food to completion.  Advised patient to take prednisone and Allegra with first dose of Augmentin for the next 5 of 10 days.  Advised may use Allegra as needed afterwards for concurrent postnasal drip/drainage.  Encouraged increase daily water intake to 64 ounces per day while taking these medications.  Advised if symptoms worsen and/or unresolved please follow-up with PCP or here for further evaluation.  Final Clinical Impressions(s) / UC Diagnoses   Final diagnoses:  Acute bacterial rhinosinusitis  Congestion of nasal sinus  Allergic rhinitis, unspecified seasonality, unspecified trigger     Discharge Instructions      Instructed patient to take medications as directed with food to completion.  Advised patient to take prednisone and Allegra with first dose of Augmentin for the next 5 of 10 days.  Advised may use Allegra as needed afterwards for concurrent postnasal drip/drainage.  Encouraged increase daily water intake to 64 ounces per day while taking these medications.  Advised if symptoms worsen and/or unresolved please follow-up with PCP or here for further evaluation.     ED Prescriptions     Medication Sig Dispense Auth. Provider   amoxicillin-clavulanate (AUGMENTIN) 875-125 MG tablet Take 1 tablet by mouth 2 (two) times daily for 10 days. 20 tablet Trevor Iha, FNP   predniSONE (DELTASONE) 20 MG tablet Take 3 tabs PO daily x 5 days. 15 tablet Trevor Iha, FNP   fexofenadine [facility] ALLERGY) 180 MG tablet Take 1 tablet (180 mg total) by mouth daily for 15 days. 15 tablet Trevor Iha, FNP      PDMP not reviewed this encounter.   Trevor Iha, FNP 06/12/23 [phone]

## 2023-06-18 ENCOUNTER — Other Ambulatory Visit: Payer: Self-pay | Admitting: Oncology

## 2023-06-18 DIAGNOSIS — Z006 Encounter for examination for normal comparison and control in clinical research program: Secondary | ICD-10-CM

## 2023-07-04 ENCOUNTER — Other Ambulatory Visit (HOSPITAL_COMMUNITY)
Admission: RE | Admit: 2023-07-04 | Discharge: 2023-07-04 | Disposition: A | Discharge status: Home / Self Care | Payer: Self-pay | Attending: Oncology | Admitting: Oncology

## 2023-07-04 DIAGNOSIS — Z006 Encounter for examination for normal comparison and control in clinical research program: Secondary | ICD-10-CM | POA: Insufficient documentation

## 2024-07-28 ENCOUNTER — Ambulatory Visit: Admitting: Podiatry

## 2024-07-28 DIAGNOSIS — M722 Plantar fascial fibromatosis: Secondary | ICD-10-CM | POA: Diagnosis not present

## 2024-07-28 NOTE — Progress Notes (Signed)
 Subjective:  Patient ID: [name] [name], female    DOB: [birth year],  MRN: [id number]  Chief Complaint  Patient presents with   Foot Pain    Left heel pain  Pt stated that she injured it about 3 weeks ago and went to [facility] and they put her in a boot told her that there was no fractures     58 y.o. female presents with the above complaint.  Patient presents with complaint left heel pain that has been going on for quite some time is progressed gotten worse worse with ambulation and shoe pressure she would like to discuss treatment options for it has not seen anyone as prior to seeing me she had had an injury 3 weeks ago and was placed in a boot by [facility].  She she has a history of plantar fasciitis   Review of Systems: Negative except as noted in the HPI. Denies N/V/F/Ch.  Past Medical History:  Diagnosis Date   Hyperlipemia     Current Outpatient Medications:    Adalimumab (HUMIRA PEN) 40 MG/0.4ML PNKT, Humira(CF) Pen 40 mg/0.4 mL subcutaneous kit, Disp: , Rfl:    ALPRAZolam (XANAX) 0.25 MG tablet, Take by mouth., Disp: , Rfl:    Cholecalciferol (VITAMIN D-3 PO), Take by mouth., Disp: , Rfl:    Cyanocobalamin  (VITAMIN B12 PO), Take by mouth., Disp: , Rfl:    escitalopram (LEXAPRO) 20 MG tablet, Take 20 mg by mouth daily., Disp: , Rfl:    fexofenadine  (ALLEGRA  ALLERGY) 180 MG tablet, Take 1 tablet (180 mg total) by mouth daily for 15 days., Disp: 15 tablet, Rfl: 0   fluticasone  (FLONASE ) 50 MCG/ACT nasal spray, Place 2 sprays into both nostrils daily., Disp: 16 g, Rfl: 0   folic acid (FOLVITE) 1 MG tablet, Take 1 mg by mouth daily., Disp: , Rfl:    hydroxychloroquine (PLAQUENIL) 200 MG tablet, Take by mouth daily., Disp: , Rfl:    ibuprofen (ADVIL) 800 MG tablet, Take by mouth., Disp: , Rfl:    predniSONE  (DELTASONE ) 20 MG tablet, Take 3 tabs PO daily x 5 days., Disp: 15 tablet, Rfl: 0  Social History   Tobacco Use  Smoking Status Never  Smokeless Tobacco Never    No  Known Allergies Objective:  There were no vitals filed for this visit. There is no height or weight on file to calculate BMI. Constitutional Well developed. Well nourished.  Vascular Dorsalis pedis pulses palpable bilaterally. Posterior tibial pulses palpable bilaterally. Capillary refill normal to all digits.  No cyanosis or clubbing noted. Pedal hair growth normal.  Neurologic Normal speech. Oriented to person, place, and time. Epicritic sensation to light touch grossly present bilaterally.  Dermatologic Nails well groomed and normal in appearance. No open wounds. No skin lesions.  Orthopedic: Normal joint ROM without pain or crepitus bilaterally. No visible deformities. Tender to palpation at the calcaneal tuber left. No pain with calcaneal squeeze left. Ankle ROM diminished range of motion left. Silfverskiold Test: positive left.   Radiographs: None  Assessment:   1. Plantar fasciitis of left foot    Plan:  Patient was evaluated and treated and all questions answered.  Plantar Fasciitis, left - XR reviewed as above.  - Educated on icing and stretching. Instructions given.  - Injection delivered to the plantar fascia as below. - DME: Plantar fascial brace dispensed to support the medial longitudinal arch of the foot and offload pressure from the heel and prevent arch collapse during weightbearing - Pharmacologic management: None  Procedure:  Injection Tendon/Ligament Location: Left plantar fascia at the glabrous junction; medial approach. Skin Prep: alcohol Injectate: 0.5 cc 0.5% marcaine plain, 0.5 cc of 1% Lidocaine, 0.5 cc kenalog  10. Disposition: Patient tolerated procedure well. Injection site dressed with a band-aid.  No follow-ups on file.

## 2024-09-01 ENCOUNTER — Ambulatory Visit (INDEPENDENT_AMBULATORY_CARE_PROVIDER_SITE_OTHER): Admitting: Podiatry

## 2024-09-01 DIAGNOSIS — Q667 Congenital pes cavus, unspecified foot: Secondary | ICD-10-CM

## 2024-09-01 DIAGNOSIS — Q6672 Congenital pes cavus, left foot: Secondary | ICD-10-CM | POA: Diagnosis not present

## 2024-09-01 DIAGNOSIS — Q6671 Congenital pes cavus, right foot: Secondary | ICD-10-CM | POA: Diagnosis not present

## 2024-09-01 DIAGNOSIS — M722 Plantar fascial fibromatosis: Secondary | ICD-10-CM | POA: Diagnosis not present

## 2024-09-01 NOTE — Progress Notes (Signed)
 Orthotics   Patient was present and evaluated for Custom molded foot orthotics. Patient will benefit from CFO's to provide total contact to BIL MLA's helping to balance and distribute body weight more evenly across BIL feet helping to reduce plantar pressure and pain. Orthotic will also encourage FF / RF alignment  Patient was scanned today and will return for fitting upon receipt  Deductible has $342.42 remaining and 20% coins patient is aware   Lolita Schultze CPed, CFo, CFm

## 2024-09-01 NOTE — Progress Notes (Signed)
 Subjective:  Patient ID: [name] [name], female    DOB: November 07, 1966,  MRN: [id number]  Chief Complaint  Patient presents with   Plantar Fasciitis    Left heel follow up pt stated that things are doing much better     58 y.o. female presents with the above complaint.  Patient presents with complaint of plantar fasciitis she says she is doing much better she still has some residual pain would like to discuss next treatment plan and she denies any other acute complaints  Review of Systems: Negative except as noted in the HPI. Denies N/V/F/Ch.  Past Medical History:  Diagnosis Date   Hyperlipemia     Current Outpatient Medications:    Adalimumab (HUMIRA PEN) 40 MG/0.4ML PNKT, Humira(CF) Pen 40 mg/0.4 mL subcutaneous kit, Disp: , Rfl:    ALPRAZolam (XANAX) 0.25 MG tablet, Take by mouth., Disp: , Rfl:    Cholecalciferol (VITAMIN D-3 PO), Take by mouth., Disp: , Rfl:    Cyanocobalamin  (VITAMIN B12 PO), Take by mouth., Disp: , Rfl:    escitalopram (LEXAPRO) 20 MG tablet, Take 20 mg by mouth daily., Disp: , Rfl:    fexofenadine  (ALLEGRA  ALLERGY) 180 MG tablet, Take 1 tablet (180 mg total) by mouth daily for 15 days., Disp: 15 tablet, Rfl: 0   fluticasone  (FLONASE ) 50 MCG/ACT nasal spray, Place 2 sprays into both nostrils daily., Disp: 16 g, Rfl: 0   folic acid (FOLVITE) 1 MG tablet, Take 1 mg by mouth daily., Disp: , Rfl:    hydroxychloroquine (PLAQUENIL) 200 MG tablet, Take by mouth daily., Disp: , Rfl:    ibuprofen (ADVIL) 800 MG tablet, Take by mouth., Disp: , Rfl:    predniSONE  (DELTASONE ) 20 MG tablet, Take 3 tabs PO daily x 5 days., Disp: 15 tablet, Rfl: 0  Social History   Tobacco Use  Smoking Status Never  Smokeless Tobacco Never    No Known Allergies Objective:  There were no vitals filed for this visit. There is no height or weight on file to calculate BMI. Constitutional Well developed. Well nourished.  Vascular Dorsalis pedis pulses palpable bilaterally. Posterior tibial  pulses palpable bilaterally. Capillary refill normal to all digits.  No cyanosis or clubbing noted. Pedal hair growth normal.  Neurologic Normal speech. Oriented to person, place, and time. Epicritic sensation to light touch grossly present bilaterally.  Dermatologic Nails well groomed and normal in appearance. No open wounds. No skin lesions.  Orthopedic: Normal joint ROM without pain or crepitus bilaterally. No visible deformities. Tender to palpation at the calcaneal tuber left. No pain with calcaneal squeeze left. Ankle ROM diminished range of motion left. Silfverskiold Test: positive left.   Radiographs: None  Assessment:   1. Plantar fasciitis of left foot   2. Pes cavus     Plan:  Patient was evaluated and treated and all questions answered.  Plantar Fasciitis, left - XR reviewed as above.  - Educated on icing and stretching. Instructions given.  - Second injection delivered to the plantar fascia as below. - DME: Plantar fascial brace dispensed to support the medial longitudinal arch of the foot and offload pressure from the heel and prevent arch collapse during weightbearing - Pharmacologic management: None  Pes cavus/foot deformity -I explained to patient the etiology of pes cavus and relationship with heel pain/arch pain and various treatment options were discussed.  Given patient foot structure in the setting of heel pain/arch pain I believe patient will benefit from custom-made orthotics to help control the hindfoot motion support  the arch of the foot and take the stress away from arches.  Patient agrees with the plan like to proceed with orthotics -Patient was casted for orthotics   Procedure: Injection Tendon/Ligament Location: Left plantar fascia at the glabrous junction; medial approach. Skin Prep: alcohol Injectate: 0.5 cc 0.5% marcaine plain, 0.5 cc of 1% Lidocaine, 0.5 cc kenalog  10. Disposition: Patient tolerated procedure well. Injection site dressed  with a band-aid.  No follow-ups on file.

## 2024-09-29 ENCOUNTER — Telehealth: Payer: Self-pay

## 2024-09-29 NOTE — Telephone Encounter (Signed)
 Orthotics are here [facility] [phone] Financial form signed and on file Appt set for 10/24

## 2024-10-01 ENCOUNTER — Other Ambulatory Visit

## 2024-10-20 ENCOUNTER — Other Ambulatory Visit

## 2024-11-17 ENCOUNTER — Ambulatory Visit (INDEPENDENT_AMBULATORY_CARE_PROVIDER_SITE_OTHER): Admitting: Podiatrist

## 2024-11-17 DIAGNOSIS — Q667 Congenital pes cavus, unspecified foot: Secondary | ICD-10-CM

## 2024-11-17 DIAGNOSIS — M722 Plantar fascial fibromatosis: Secondary | ICD-10-CM

## 2024-11-17 NOTE — Progress Notes (Signed)
 ORTHOTIC DISPENSING:   Reason for Visit:         Fitting and Delivery of Custom Fabricated Foot Orthoses Patient Report:            Patient reports comfort and is satisfied with device.   OBJECTIVE DATA: Patient History / Diagnosis:    No change in pathology Provided Device:                     Functional foot orthoses   GOAL OF ORTHOSIS - Improve gait - Decrease energy expenditure - Improve Balance - Provide Triplanar stability of foot complex - Facilitate motion   ACTIONS PERFORMED Patient was fit with custom foot orthoses   Patient was provided with verbal and written instruction and demonstration regarding wear, care, proper fit, function, and use of the orthosis.    Patient was also provided with verbal instruction regarding how to report any failures or malfunctions of the orthosis and necessary follow up care. Patient was also instructed to contact our office regarding any change in status that may affect the function of the orthosis.   Patient demonstrated understanding of all instructions.  2 pr were ordered and she only wanted 1 pr.  Billed only for 1 pr. Per Ann[place]ele-

## 2024-12-06 ENCOUNTER — Telehealth: Payer: Self-pay

## 2024-12-06 NOTE — Telephone Encounter (Signed)
 LV for pt to call office regarding extra pair of orthotics that are in the office.  Can stop by anytime to pick up
